# Patient Record
Sex: Female | Born: 1995 | Race: Black or African American | Hispanic: No | Marital: Married | Smoking: Never smoker
Health system: Southern US, Community
[De-identification: ages and names within clinical notes are randomized; demographics above are authoritative.]

## PROBLEM LIST (undated history)

## (undated) DIAGNOSIS — O24419 Gestational diabetes mellitus in pregnancy, unspecified control: Secondary | ICD-10-CM

## (undated) HISTORY — PX: NO PAST SURGERIES: SHX2092

## (undated) HISTORY — DX: Gestational diabetes mellitus in pregnancy, unspecified control: O24.419

---

## 2017-05-04 NOTE — L&D Delivery Note (Signed)
Called to room when pt c/o urge to push and BOW seen at introitus.  Had received 2 doses of cytotec.  Pt pushed and had SROM of brown fluid. Baby delivered w/next push.  Wt pending. Obvious anomalies noted.  Cord clamped and cut, baby taken out of room to prepare for parent's viewing per request.  At this time, there is no bleeding and placenta is still attached.  Pitocin bolus infusing.

## 2017-06-03 ENCOUNTER — Ambulatory Visit (INDEPENDENT_AMBULATORY_CARE_PROVIDER_SITE_OTHER): Payer: Medicaid Other | Admitting: Student

## 2017-06-03 ENCOUNTER — Other Ambulatory Visit (HOSPITAL_COMMUNITY)
Admission: RE | Admit: 2017-06-03 | Discharge: 2017-06-03 | Disposition: A | Discharge status: Home / Self Care | Payer: Medicaid Other | Source: Ambulatory Visit | Attending: Student | Admitting: Student

## 2017-06-03 ENCOUNTER — Encounter: Payer: Self-pay | Admitting: Student

## 2017-06-03 VITALS — BP 120/79 | HR 95 | Ht 61.0 in | Wt 103.3 lb

## 2017-06-03 DIAGNOSIS — Z3400 Encounter for supervision of normal first pregnancy, unspecified trimester: Secondary | ICD-10-CM | POA: Insufficient documentation

## 2017-06-03 DIAGNOSIS — Z3401 Encounter for supervision of normal first pregnancy, first trimester: Secondary | ICD-10-CM | POA: Diagnosis not present

## 2017-06-03 LAB — POCT URINALYSIS DIP (DEVICE)
Bilirubin Urine: NEGATIVE
Glucose, UA: NEGATIVE mg/dL
HGB URINE DIPSTICK: NEGATIVE
NITRITE: NEGATIVE
PH: 7 (ref 5.0–8.0)
Protein, ur: NEGATIVE mg/dL
Specific Gravity, Urine: 1.02 (ref 1.005–1.030)
UROBILINOGEN UA: 0.2 mg/dL (ref 0.0–1.0)

## 2017-06-03 NOTE — Patient Instructions (Signed)

## 2017-06-03 NOTE — Progress Notes (Signed)
  Subjective:    Felicia Clark is being seen today for her first obstetrical visit.  This is not a planned pregnancy. She is at [redacted]w[redacted]d gestation. Her obstetrical history is significant for nothing. Relationship with FOB: significant other, not living together. Patient does intend to breast feed. Pregnancy history fully reviewed.  Patient reports no complaints. Feeling good; still in school.   Review of Systems:   Review of Systems  Constitutional: Negative.   HENT: Negative.   Respiratory: Negative.   Cardiovascular: Negative.   Gastrointestinal: Negative.   Genitourinary: Negative.   Neurological: Negative.   Hematological: Negative.   Psychiatric/Behavioral: Negative.     Objective:     BP 120/79   Pulse 95   Ht 5\' 1"  (1.549 m)   Wt 103 lb 4.8 oz (46.9 kg)   LMP 03/26/2017 (Exact Date)   BMI 19.52 kg/m  Physical Exam  Constitutional: She is oriented to person, place, and time. She appears well-developed.  HENT:  Head: Normocephalic.  Neck: Normal range of motion.  Cardiovascular: Normal rate.  Respiratory: Effort normal.  GI: Soft.  Genitourinary: Vagina normal.  Musculoskeletal: Normal range of motion.  Neurological: She is alert and oriented to person, place, and time.  Skin: Skin is warm and dry.  Psychiatric: She has a normal mood and affect.    Exam    Assessment:    Pregnancy: G1P0 Patient Active Problem List   Diagnosis Date Noted  . Supervision of normal first pregnancy, antepartum 06/03/2017       Plan:     Initial labs drawn. Prenatal vitamins. Problem list reviewed and updated. AFP3 discussed: requested. Will do panorama today.  Role of ultrasound in pregnancy discussed; fetal survey: will order at next visit. . Amniocentesis discussed: not indicated. Follow up in 3 weeks. 75% of 30 min visit spent on counseling and coordination of care.  -Enrolled in [employer] -oriented patient to [employer] -Pap today -Warning signs discussed/when  to seek care at the [facility].   Mervyn Skeeters [facility] 06/03/2017

## 2017-06-03 NOTE — Addendum Note (Signed)
Addended by: Wendelyn Breslow L on: 06/03/2017 03:08 PM   Modules accepted: Orders

## 2017-06-04 LAB — OBSTETRIC PANEL, INCLUDING HIV
ANTIBODY SCREEN: NEGATIVE
BASOS: 0 %
Basophils Absolute: 0 10*3/uL (ref 0.0–0.2)
EOS (ABSOLUTE): 0 10*3/uL (ref 0.0–0.4)
EOS: 0 %
HEMATOCRIT: 37.8 % (ref 34.0–46.6)
HEMOGLOBIN: 12.5 g/dL (ref 11.1–15.9)
HIV Screen 4th Generation wRfx: NONREACTIVE
Hepatitis B Surface Ag: NEGATIVE
IMMATURE GRANS (ABS): 0 10*3/uL (ref 0.0–0.1)
IMMATURE GRANULOCYTES: 0 %
LYMPHS: 16 %
Lymphocytes Absolute: 1 10*3/uL (ref 0.7–3.1)
MCH: 29.1 pg (ref 26.6–33.0)
MCHC: 33.1 g/dL (ref 31.5–35.7)
MCV: 88 fL (ref 79–97)
MONOCYTES: 10 %
Monocytes Absolute: 0.6 10*3/uL (ref 0.1–0.9)
NEUTROS PCT: 74 %
Neutrophils Absolute: 4.6 10*3/uL (ref 1.4–7.0)
Platelets: 344 10*3/uL (ref 150–379)
RBC: 4.29 x10E6/uL (ref 3.77–5.28)
RDW: 14 % (ref 12.3–15.4)
RH TYPE: POSITIVE
RPR Ser Ql: NONREACTIVE
RUBELLA: 3.11 {index} (ref >0.99)
WBC: 6.3 10*3/uL (ref 3.4–10.8)

## 2017-06-04 LAB — CYTOLOGY - PAP
Chlamydia: NEGATIVE
Diagnosis: NEGATIVE
NEISSERIA GONORRHEA: NEGATIVE

## 2017-06-04 LAB — HEMOGLOBINOPATHY EVALUATION
Ferritin: 39 ng/mL (ref 15–150)
HGB A: 97.4 % (ref 96.4–98.8)
HGB S: 0 %
HGB SOLUBILITY: NEGATIVE
HGB VARIANT: 0 %
Hgb A2 Quant: 2.6 % (ref 1.8–3.2)
Hgb C: 0 %
Hgb F Quant: 0 % (ref 0.0–2.0)

## 2017-06-05 LAB — CULTURE, OB URINE

## 2017-06-05 LAB — URINE CULTURE, OB REFLEX

## 2017-06-14 LAB — SMN1 COPY NUMBER ANALYSIS (SMA CARRIER SCREENING)

## 2017-06-15 ENCOUNTER — Encounter: Payer: Self-pay | Admitting: *Deleted

## 2017-06-24 ENCOUNTER — Ambulatory Visit (INDEPENDENT_AMBULATORY_CARE_PROVIDER_SITE_OTHER): Payer: Medicaid Other | Admitting: Student

## 2017-06-24 VITALS — BP 120/72 | HR 81 | Wt 103.5 lb

## 2017-06-24 DIAGNOSIS — Z3491 Encounter for supervision of normal pregnancy, unspecified, first trimester: Secondary | ICD-10-CM

## 2017-06-24 DIAGNOSIS — Z3402 Encounter for supervision of normal first pregnancy, second trimester: Secondary | ICD-10-CM

## 2017-06-24 DIAGNOSIS — Z3490 Encounter for supervision of normal pregnancy, unspecified, unspecified trimester: Secondary | ICD-10-CM | POA: Insufficient documentation

## 2017-06-24 DIAGNOSIS — Z3400 Encounter for supervision of normal first pregnancy, unspecified trimester: Secondary | ICD-10-CM

## 2017-06-24 NOTE — Patient Instructions (Signed)

## 2017-06-24 NOTE — Progress Notes (Signed)
   PRENATAL VISIT NOTE  Subjective:  Felicia Clark is a 22 y.o. G1P0 at [redacted]w[redacted]d being seen today for ongoing prenatal care.  She is currently monitored for the following issues for this low-risk pregnancy and has Supervision of normal first pregnancy, antepartum and Uncertain dates, antepartum on their problem list.  Patient reports occasional cramps on her right side. Not associated with vaginal discharge, dysuria, back pain or bleeding. .  Contractions: Not present. Vag. Bleeding: None.  Movement: Absent. Denies leaking of fluid.   The following portions of the patient's history were reviewed and updated as appropriate: allergies, current medications, past family history, past medical history, past social history, past surgical history and problem list. Problem list updated.  Objective:   Vitals:   06/24/17 1141  BP: 120/72  Pulse: 81  Weight: 103 lb 8 oz (46.9 kg)    Fetal Status: Fetal Heart Rate (bpm): 152   Movement: Absent     General:  Alert, oriented and cooperative. Patient is in no acute distress.  Skin: Skin is warm and dry. No rash noted.   Cardiovascular: Normal heart rate noted  Respiratory: Normal respiratory effort, no problems with respiration noted  Abdomen: Soft, gravid, appropriate for gestational age.  Pain/Pressure: Absent     Pelvic: Cervical exam deferred        Extremities: Normal range of motion.  Edema: None  Mental Status:  Normal mood and affect. Normal behavior. Normal judgment and thought content.   Assessment and Plan:  Pregnancy: G1P0 at [redacted]w[redacted]d  1. Supervision of normal first pregnancy, antepartum  - [place] MFM OB COMP + 14 WK; Future - US OB Transvaginal; Future - US OB Comp Less 14 Wks; Future  2. Pregnancy with uncertain dates in first trimester -Patient is 12 weeks 6 days based on uncertain LMP, will order limited US for dating.  - Preterm labor symptoms and general obstetric precautions including but not limited to vaginal bleeding,  contractions, leaking of fluid and fetal movement were reviewed in detail with the patient. Please refer to After Visit Summary for other counseling recommendations.  Return in about 8 weeks (around 08/19/2017).   Starr Lake, CNM

## 2017-06-30 ENCOUNTER — Ambulatory Visit (HOSPITAL_COMMUNITY)
Admission: RE | Admit: 2017-06-30 | Discharge: 2017-06-30 | Disposition: A | Discharge status: Home / Self Care | Payer: Medicaid Other | Source: Ambulatory Visit | Attending: Student | Admitting: Student

## 2017-06-30 ENCOUNTER — Other Ambulatory Visit (HOSPITAL_COMMUNITY): Payer: Self-pay | Admitting: Obstetrics and Gynecology

## 2017-06-30 ENCOUNTER — Encounter (HOSPITAL_COMMUNITY): Payer: Self-pay

## 2017-06-30 ENCOUNTER — Other Ambulatory Visit: Payer: Self-pay | Admitting: Student

## 2017-06-30 ENCOUNTER — Ambulatory Visit (HOSPITAL_COMMUNITY)
Admission: RE | Admit: 2017-06-30 | Discharge: 2017-06-30 | Disposition: A | Discharge status: Home / Self Care | Payer: Medicaid Other | Source: Ambulatory Visit | Attending: Obstetrics and Gynecology | Admitting: Obstetrics and Gynecology

## 2017-06-30 DIAGNOSIS — O283 Abnormal ultrasonic finding on antenatal screening of mother: Secondary | ICD-10-CM

## 2017-06-30 DIAGNOSIS — O358XX0 Maternal care for other (suspected) fetal abnormality and damage, not applicable or unspecified: Secondary | ICD-10-CM | POA: Diagnosis not present

## 2017-06-30 DIAGNOSIS — Z3492 Encounter for supervision of normal pregnancy, unspecified, second trimester: Secondary | ICD-10-CM

## 2017-06-30 DIAGNOSIS — Z3A15 15 weeks gestation of pregnancy: Secondary | ICD-10-CM | POA: Diagnosis not present

## 2017-06-30 DIAGNOSIS — Z3400 Encounter for supervision of normal first pregnancy, unspecified trimester: Secondary | ICD-10-CM

## 2017-06-30 DIAGNOSIS — O359XX0 Maternal care for (suspected) fetal abnormality and damage, unspecified, not applicable or unspecified: Secondary | ICD-10-CM

## 2017-06-30 DIAGNOSIS — O3680X0 Pregnancy with inconclusive fetal viability, not applicable or unspecified: Secondary | ICD-10-CM

## 2017-06-30 DIAGNOSIS — Z3687 Encounter for antenatal screening for uncertain dates: Secondary | ICD-10-CM | POA: Insufficient documentation

## 2017-07-01 ENCOUNTER — Other Ambulatory Visit (HOSPITAL_COMMUNITY): Payer: Self-pay | Admitting: *Deleted

## 2017-07-01 DIAGNOSIS — R609 Edema, unspecified: Secondary | ICD-10-CM

## 2017-07-01 LAB — PARVOVIRUS B19 ANTIBODY, IGG AND IGM
PAROVIRUS B19 IGG ABS: 0.5 {index} (ref 0.0–0.8)
PAROVIRUS B19 IGM ABS: 0.2 {index} (ref 0.0–0.8)

## 2017-07-01 LAB — TOXOPLASMA ANTIBODIES- IGG AND  IGM: TOXOPLASMA ANTIBODY- IGM: 3.1 [AU]/ml (ref 0.0–7.9)

## 2017-07-01 LAB — CMV IGM: CMV IgM: <30 AU/mL (ref 0.0–29.9)

## 2017-07-01 LAB — CMV ANTIBODY, IGG (EIA): CMV Ab - IgG: <0.6 U/mL (ref 0.00–0.59)

## 2017-07-05 ENCOUNTER — Encounter: Payer: Self-pay | Admitting: *Deleted

## 2017-07-05 ENCOUNTER — Telehealth (HOSPITAL_COMMUNITY): Payer: Self-pay | Admitting: Obstetrics and Gynecology

## 2017-07-05 NOTE — Telephone Encounter (Signed)
MFM   Left message for patient to call [facility] to discuss lab work done on 06/30/17  [place], MD

## 2017-07-06 ENCOUNTER — Telehealth (HOSPITAL_COMMUNITY): Payer: Self-pay | Admitting: *Deleted

## 2017-07-08 ENCOUNTER — Other Ambulatory Visit (HOSPITAL_COMMUNITY): Payer: Self-pay | Admitting: Obstetrics and Gynecology

## 2017-07-08 ENCOUNTER — Encounter (HOSPITAL_COMMUNITY): Payer: Self-pay

## 2017-07-08 ENCOUNTER — Ambulatory Visit (HOSPITAL_COMMUNITY)
Admission: RE | Admit: 2017-07-08 | Discharge: 2017-07-08 | Disposition: A | Discharge status: Home / Self Care | Payer: Medicaid Other | Source: Ambulatory Visit | Attending: Student | Admitting: Student

## 2017-07-08 ENCOUNTER — Encounter: Payer: Self-pay | Admitting: Student

## 2017-07-08 DIAGNOSIS — R601 Generalized edema: Secondary | ICD-10-CM | POA: Diagnosis not present

## 2017-07-08 DIAGNOSIS — Z363 Encounter for antenatal screening for malformations: Secondary | ICD-10-CM | POA: Diagnosis present

## 2017-07-08 DIAGNOSIS — D181 Lymphangioma, any site: Secondary | ICD-10-CM | POA: Diagnosis not present

## 2017-07-08 DIAGNOSIS — Z3A20 20 weeks gestation of pregnancy: Secondary | ICD-10-CM | POA: Insufficient documentation

## 2017-07-08 DIAGNOSIS — J90 Pleural effusion, not elsewhere classified: Secondary | ICD-10-CM | POA: Insufficient documentation

## 2017-07-08 DIAGNOSIS — Z3A14 14 weeks gestation of pregnancy: Secondary | ICD-10-CM | POA: Insufficient documentation

## 2017-07-08 DIAGNOSIS — O359XX0 Maternal care for (suspected) fetal abnormality and damage, unspecified, not applicable or unspecified: Secondary | ICD-10-CM | POA: Diagnosis not present

## 2017-07-08 DIAGNOSIS — R609 Edema, unspecified: Secondary | ICD-10-CM

## 2017-07-08 DIAGNOSIS — Z3400 Encounter for supervision of normal first pregnancy, unspecified trimester: Secondary | ICD-10-CM

## 2017-07-08 DIAGNOSIS — Z3A16 16 weeks gestation of pregnancy: Secondary | ICD-10-CM

## 2017-07-08 DIAGNOSIS — R188 Other ascites: Secondary | ICD-10-CM | POA: Insufficient documentation

## 2017-07-08 DIAGNOSIS — O358XX0 Maternal care for other (suspected) fetal abnormality and damage, not applicable or unspecified: Secondary | ICD-10-CM | POA: Insufficient documentation

## 2017-07-08 NOTE — Progress Notes (Addendum)
Genetic Counseling  High-Risk Gestation Note  Appointment Date:  07/08/2017 Referred By: Luna Glasgow* Date of Birth:  10-May-1995 Partner:  [place], Brooke Bonito.    Pregnancy History: G1P0 Estimated Date of Delivery: 12/31/17 Estimated Gestational Age: 39w6dAttending: MRenella Cunas MD   I met with Ms. Felicia Bradfordand her partner, Mr. 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    In summary:  Discussed ultrasound findings in detail  Presence of hydrops associated with poor prognosis for pregnancy  Reviewed previous screening results  NIPS- Panorama within normal limits  Maternal TORCH studies  Reviewed options for additional screening  NIPS for single gene conditions ([facility]istara)-declined  Echocardiogram  Ongoing ultrasound  Reviewed options for diagnostic testing, including risks, benefits, limitations and alternatives-declined  Discussed option of continuation vs. Termination of pregnancy  Couple plans to continue pregnancy with expectant management  Reviewed family history concerns  We began by reviewing the ultrasound in detail. Ultrasound today again visualized cystic hygroma with ascites. Complete ultrasound results reported under separate cover. Ms. KWeimannpreviously had noninvasive prenatal screening (NIPS)/prenatal cell free DNA testing, which was within normal limits, and maternal TORCH titers were drawn 06/30/17. The couple understands that the presence of hydrops on ultrasound is associated with a very poor prognosis for the pregnancy.    They were counseled that a cystic hygroma describes a septated fluid filled sac at the back of the neck that typically results from failure of the fetal lymphatic system.  We discussed the various etiologies for a hygroma including normal variation (immature lymphatic system), a chromosome condition, single gene condition, or a congenital anomaly (heart  defect).   We discussed chromosomes, non-disjunction, age related risks for aneuploidy, and the ~50% ultrasound adjusted risk for a chromosome condition based on the finding of a cystic hygroma.  We discussed the common features of Down syndrome, Turner syndrome, and trisomies 13 and 18.  Ms. KFretwellpreviously had noninvasive prenatal screening (NIPS)/cell free DNA (cfDNA) testing through her obstetrician's office.  We reviewed that while this testing has a high sensitivity and specificity, it is not considered to be diagnostic.  For trisomy 275and trisomy 15 the detection rate is 99%; for trisomy 151 the detection rate is 96%, and for Turner syndrome, the detection rate is ~92%.  We reviewed Ms. KHillmannormal Panorama results and the associated significant reduction in risks for fetal aneuploidy.  We also discussed that cystic hygromas can also be caused by a variety of other chromosome aberrations including: microdeletions, microduplications, and translocations.  She was counseled that NIPS/cfDNA testing can detect a few microdeletions, but not those most commonly associated with cystic hygromas or abnormal nuchal translucency measurements.  We reviewed the availability of diagnostic testing for chromosome conditions through karyotype and chromosomal microarray analysis on amniocentesis.  She was counseled regarding the associated risks for complications with amniocentesis, including spontaneous pregnancy loss. After consideration of all the options, the couple declined amniocentesis.      We also discussed that a cystic hygroma can be a feature of many underlying single gene conditions, such as Noonan syndrome, SLOS, and skeletal dysplasias.  We discussed that prenatal diagnosis is available for some single gene conditions, but that in most cases targeted analysis is recommended by a medical geneticist following a post-natal physical exam and review of medical records.  If however, ultrasound findings or a  positive family history strongly suggest an increased risk for a specific condition, testing may  be performed prenatally.    Regarding available prenatal screening for certain single gene conditions, we discussed the option of single gene NIPT (Vistara by Johnsie Cancel). We discussed that there is a newer NIPS platform ([facility]istara through [facility]) that is able to assess for mutations in a panel of 30 selected genes. The conditions selected for this panel are autosomal dominant or X-linked conditions that typically occur due to de novo gene variations. We reviewed the methodology behind this screen and that the reported detection rate ranges from 43% to greater than 96%, depending upon the specific condition and specific gene. We reviewed possible results including positive, negative, unexpected findings, and no result. We reviewed limitations of the test including that it is not diagnostic, it relies on a high enough fetal fraction in the sample, and that if the patient carriers a mutation in one of the genes on the panel, analysis cannot be performed for the pregnancy. We also discussed that if a mutation is identified in the paternal sample, this will also be reported to the couple. We reviewed the billing process and possible cost of the test. Ms.Hitsman declined Vistara at this time.   We discussed the option of carrier screening for a select panel of autosomal recessive and some X-linked conditions, some of which but not all can have cystic hygroma and hydrops as an associated feature. ACOG currently recommends that all patients be offered carrier screening for cystic fibrosis, spinal muscular atrophy and hemoglobinopathies. In addition, they were counseled that there are a variety of genetic screening laboratories that have pan-ethnic, or expanded, carrier screening panels, which evaluate carrier status for a wide range of genetic conditions. Some of these conditions are severe and actionable, but also rare; others  occur more commonly, but are less severe. We discussed that testing options range from screening for a single condition to panels of more than 200 autosomal or X-linked genetic conditions. The prevalence of each condition varies (and often varies with ethnicity). Thus the couples' background risk to be a carrier for each of these various conditions would range, and in some cases be very low or unknown. We reviewed that a negative carrier screen would thus reduce, but not eliminate the chance to be a carrier for these conditions. For some conditions included on specific pan-ethnic carrier screening panels, the phenotype may not yet be well defined. For the majority of conditions on pan-ethnic carrier screening panels, identification of carrier status is not expected to be associated with medical features for the carrier; However, there are currently few exceptions where carrier status has been shown to increase the chance for certain medical concerns. We reviewed that in the event that one partner is found to be a carrier for one or more conditions, carrier screening would be available to the partner for those conditions. We discussed the risks, benefits, and limitations of carrier screening with the couple. After thoughtful consideration of their options, Ms. Preet Foresta declined expanded pan-ethnic carrier screening (including ACOG recommended panel) at this time.   Additionally, we discussed that congenital heart defects (CHD) are commonly associated with cystic hygromas.  The prevalence of a CHD is in the order of 6 times higher in fetuses with a cystic hygroma/increased nuchal translucency as compared to an unselected population.  There does not appear to be an association between any specific CHD and cystic hygromas.   We discussed that the chance for a CHD increases exponentially with increasing NT measurements.     We discussed options for the  pregnancy including continuing with expectant management and  termination of pregnancy. The couple declined termination of pregnancy at this time and plan to continue the pregnancy. Follow-up ultrasound was scheduled in 1 weeks to reassess hydrops and assess for viability.     Both family histories were reviewed and found to be contributory for a female maternal second cousin to the patient with Down syndrome. We discussed that 95% of cases of Down syndrome are not inherited and are the result of non-disjunction.  Three to 4% of cases of Down syndrome are the result of a translocation involving chromosome #21.  We discussed the option of chromosome analysis to determine if an individual is a carrier of a balanced translocation involving chromosome #21.  If an individual carries a balanced translocation involving chromosome #21, then the chance to have a baby with Down syndrome would be greater than the maternal age-related risk. The reported family history is most suggestive of sporadically occurring Down syndrome. Without further information regarding the provided family history, an accurate genetic risk cannot be calculated. Further genetic counseling is warranted if more information is obtained.   Ms. Jaren Kearn denied exposure to environmental toxins or chemical agents. She denied the use of alcohol, tobacco or street drugs. She denied significant viral illnesses during the course of her pregnancy.     I counseled this couple regarding the above risks and available options.  The approximate face-to-face time with the genetic counselor was 40 minutes.  Chipper Oman, MS Certified Genetic Counselor 07/08/2017

## 2017-07-09 ENCOUNTER — Other Ambulatory Visit (HOSPITAL_COMMUNITY): Payer: Self-pay | Admitting: *Deleted

## 2017-07-16 ENCOUNTER — Encounter (HOSPITAL_COMMUNITY): Payer: Self-pay

## 2017-07-16 ENCOUNTER — Ambulatory Visit (HOSPITAL_COMMUNITY)
Admission: RE | Admit: 2017-07-16 | Discharge: 2017-07-16 | Disposition: A | Discharge status: Home / Self Care | Payer: Medicaid Other | Source: Ambulatory Visit | Attending: Student | Admitting: Student

## 2017-07-16 ENCOUNTER — Other Ambulatory Visit (HOSPITAL_COMMUNITY): Payer: Self-pay | Admitting: Maternal and Fetal Medicine

## 2017-07-16 DIAGNOSIS — O359XX0 Maternal care for (suspected) fetal abnormality and damage, unspecified, not applicable or unspecified: Secondary | ICD-10-CM

## 2017-07-16 DIAGNOSIS — Z3A17 17 weeks gestation of pregnancy: Secondary | ICD-10-CM

## 2017-07-16 DIAGNOSIS — Z362 Encounter for other antenatal screening follow-up: Secondary | ICD-10-CM

## 2017-07-16 DIAGNOSIS — Z3400 Encounter for supervision of normal first pregnancy, unspecified trimester: Secondary | ICD-10-CM

## 2017-07-19 ENCOUNTER — Other Ambulatory Visit (HOSPITAL_COMMUNITY): Payer: Self-pay | Admitting: *Deleted

## 2017-07-19 DIAGNOSIS — D181 Lymphangioma, any site: Secondary | ICD-10-CM

## 2017-07-21 ENCOUNTER — Telehealth: Payer: Self-pay | Admitting: General Practice

## 2017-07-21 NOTE — Telephone Encounter (Signed)
Patient was removed from babyscripts.  Called patient to schedule follow up OB appointment for 07/29/17 at 2:55pm.  Patient voiced understanding.

## 2017-07-23 ENCOUNTER — Ambulatory Visit (HOSPITAL_COMMUNITY)
Admission: RE | Admit: 2017-07-23 | Discharge: 2017-07-23 | Disposition: A | Discharge status: Home / Self Care | Payer: Medicaid Other | Source: Ambulatory Visit | Attending: Student | Admitting: Student

## 2017-07-23 ENCOUNTER — Other Ambulatory Visit (HOSPITAL_COMMUNITY): Payer: Self-pay | Admitting: Obstetrics and Gynecology

## 2017-07-23 ENCOUNTER — Other Ambulatory Visit (HOSPITAL_COMMUNITY): Payer: Self-pay | Admitting: *Deleted

## 2017-07-23 ENCOUNTER — Encounter (HOSPITAL_COMMUNITY): Payer: Self-pay

## 2017-07-23 DIAGNOSIS — O358XX0 Maternal care for other (suspected) fetal abnormality and damage, not applicable or unspecified: Secondary | ICD-10-CM | POA: Diagnosis not present

## 2017-07-23 DIAGNOSIS — O359XX0 Maternal care for (suspected) fetal abnormality and damage, unspecified, not applicable or unspecified: Secondary | ICD-10-CM | POA: Diagnosis present

## 2017-07-23 DIAGNOSIS — Z3A18 18 weeks gestation of pregnancy: Secondary | ICD-10-CM

## 2017-07-23 DIAGNOSIS — Z362 Encounter for other antenatal screening follow-up: Secondary | ICD-10-CM

## 2017-07-23 DIAGNOSIS — J90 Pleural effusion, not elsewhere classified: Secondary | ICD-10-CM | POA: Diagnosis not present

## 2017-07-23 DIAGNOSIS — R601 Generalized edema: Secondary | ICD-10-CM | POA: Diagnosis not present

## 2017-07-23 DIAGNOSIS — R188 Other ascites: Secondary | ICD-10-CM | POA: Diagnosis not present

## 2017-07-23 DIAGNOSIS — D181 Lymphangioma, any site: Secondary | ICD-10-CM | POA: Diagnosis not present

## 2017-07-23 DIAGNOSIS — Z3400 Encounter for supervision of normal first pregnancy, unspecified trimester: Secondary | ICD-10-CM

## 2017-07-27 ENCOUNTER — Encounter (HOSPITAL_COMMUNITY): Payer: Self-pay

## 2017-07-27 ENCOUNTER — Ambulatory Visit (HOSPITAL_COMMUNITY)
Admission: RE | Admit: 2017-07-27 | Discharge: 2017-07-27 | Disposition: A | Discharge status: Home / Self Care | Payer: Medicaid Other | Source: Ambulatory Visit | Attending: Student | Admitting: Student

## 2017-07-27 DIAGNOSIS — Z3400 Encounter for supervision of normal first pregnancy, unspecified trimester: Secondary | ICD-10-CM

## 2017-07-27 DIAGNOSIS — O359XX0 Maternal care for (suspected) fetal abnormality and damage, unspecified, not applicable or unspecified: Secondary | ICD-10-CM | POA: Insufficient documentation

## 2017-07-27 DIAGNOSIS — Z3A18 18 weeks gestation of pregnancy: Secondary | ICD-10-CM | POA: Insufficient documentation

## 2017-07-27 DIAGNOSIS — O358XX0 Maternal care for other (suspected) fetal abnormality and damage, not applicable or unspecified: Secondary | ICD-10-CM

## 2017-07-28 ENCOUNTER — Other Ambulatory Visit (HOSPITAL_COMMUNITY): Payer: Self-pay | Admitting: *Deleted

## 2017-07-28 NOTE — Progress Notes (Signed)
Met with patient briefly today following her ultrasound appt. Patient previously saw Santiago Glad Corneliussen for genetic counseling and had questions today regarding additional genetic testing options. Discussed previously performed screening tests and results. Discussed the difference between diagnostic testing versus screening. We reviewed the availability of amniocentesis with karyotype and microarray analyses. We reviewed the benefits, limitations, and risks of amniocentesis, including the 1 in 300-500 risk for miscarriage. We reviewed the option of Vistara screening (single gene NIPS), which screens placental DNA in maternal circulation for specific mutations in a panel of 30 selected genes covering 26 conditions. Most of these conditions follow an autosomal dominant pattern of inheritance and typically occur due to de novo gene mutations. The detection rates for these conditions vary depending upon the specific condition but range from 43% to 96%. Therefore, this screening would not identify all new dominant gene mutations. We reviewed that a low risk Vistara result reduces the likelihood of some gene conditions, which can be associated with a cystic hygroma/hydrops, but does not rule out all genetic conditions. We reviewed that Vistara screening requires a maternal and paternal sample of blood. The patient's partner was not present at the ultrasound today. She stated that she has an OBV on Thursday, 07/29/17, and will bring her partner with her to [facility] to pursue Vistara screening. She declined amniocentesis at this time. All questions were answered and concerns addressed.   I met with this patient for 10 minutes following her ultrasound appt. She was encouraged to call with additional questions/concerns.  Filbert Schilder, MS CGC

## 2017-07-29 ENCOUNTER — Ambulatory Visit (INDEPENDENT_AMBULATORY_CARE_PROVIDER_SITE_OTHER): Payer: Medicaid Other | Admitting: Family Medicine

## 2017-07-29 VITALS — BP 110/62 | HR 93 | Wt 105.0 lb

## 2017-07-29 DIAGNOSIS — O1202 Gestational edema, second trimester: Secondary | ICD-10-CM

## 2017-07-29 DIAGNOSIS — O358XX0 Maternal care for other (suspected) fetal abnormality and damage, not applicable or unspecified: Secondary | ICD-10-CM | POA: Diagnosis not present

## 2017-07-29 DIAGNOSIS — Z3400 Encounter for supervision of normal first pregnancy, unspecified trimester: Secondary | ICD-10-CM

## 2017-07-29 DIAGNOSIS — Z3402 Encounter for supervision of normal first pregnancy, second trimester: Secondary | ICD-10-CM | POA: Diagnosis present

## 2017-07-29 NOTE — Progress Notes (Signed)
   PRENATAL VISIT NOTE  Subjective:  Felicia Clark is a 22 y.o. G1P0 at [redacted]w[redacted]d being seen today for ongoing prenatal care.  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  Patient reports no complaints.  Contractions: Not present. Vag. Bleeding: None.  Movement: Absent. Denies leaking of fluid.   The following portions of the patient's history were reviewed and updated as appropriate: allergies, current medications, past family history, past medical history, past social history, past surgical history and problem list. Problem list updated.  Objective:   Vitals:   07/29/17 1456  BP: 110/62  Pulse: 93  Weight: 105 lb (47.6 kg)    Fetal Status: Fetal Heart Rate (bpm): 144 Fundal Height: 17 cm Movement: Absent     General:  Alert, oriented and cooperative. Patient is in no acute distress.  Skin: Skin is warm and dry. No rash noted.   Cardiovascular: Normal heart rate noted  Respiratory: Normal respiratory effort, no problems with respiration noted  Abdomen: Soft, gravid, appropriate for gestational age.  Pain/Pressure: Absent     Pelvic: Cervical exam deferred        Extremities: Normal range of motion.  Edema: None  Mental Status:  Normal mood and affect. Normal behavior. Normal judgment and thought content.   Assessment and Plan:  Pregnancy: G1P0 at [redacted]w[redacted]d  1. Supervision of normal first pregnancy, antepartum FHT and FH normal. AFP drawn today - AFP, Serum, Open Spina Bifida  2. 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 Getting weekly scans for viability with MFM. Does not wish to terminate pregnancy.  - AFP, Serum, Open Spina Bifida  3. Gestational edema in second trimester   Preterm labor symptoms and general obstetric  precautions including but not limited to vaginal bleeding, contractions, leaking of fluid and fetal movement were reviewed in detail with the patient. Please refer to After Visit Summary for other counseling recommendations.  Return in about 3 weeks (around 08/19/2017) for HR OB f/u.   Truett Mainland, DO

## 2017-08-01 LAB — AFP, SERUM, OPEN SPINA BIFIDA
AFP MOM: 0.72
AFP VALUE AFPOSL: 40.7 ng/mL
Gest. Age on Collection Date: 17.6 weeks
MATERNAL AGE AT EDD: 23.7 a
OSBR Risk 1 IN: 10000
Test Results:: NEGATIVE
WEIGHT: 105 [lb_av]

## 2017-08-03 ENCOUNTER — Ambulatory Visit (HOSPITAL_COMMUNITY): Admission: RE | Admit: 2017-08-03 | Payer: Medicaid Other | Source: Ambulatory Visit

## 2017-08-03 ENCOUNTER — Encounter (HOSPITAL_COMMUNITY): Payer: Self-pay

## 2017-08-03 ENCOUNTER — Ambulatory Visit (HOSPITAL_COMMUNITY)
Admission: RE | Admit: 2017-08-03 | Discharge: 2017-08-03 | Disposition: A | Discharge status: Home / Self Care | Payer: Medicaid Other | Source: Ambulatory Visit | Attending: Student | Admitting: Student

## 2017-08-03 ENCOUNTER — Other Ambulatory Visit (HOSPITAL_COMMUNITY): Payer: Self-pay | Admitting: Obstetrics and Gynecology

## 2017-08-03 DIAGNOSIS — O359XX0 Maternal care for (suspected) fetal abnormality and damage, unspecified, not applicable or unspecified: Secondary | ICD-10-CM

## 2017-08-03 DIAGNOSIS — Z3A19 19 weeks gestation of pregnancy: Secondary | ICD-10-CM

## 2017-08-03 DIAGNOSIS — O358XX0 Maternal care for other (suspected) fetal abnormality and damage, not applicable or unspecified: Secondary | ICD-10-CM | POA: Insufficient documentation

## 2017-08-03 NOTE — Addendum Note (Signed)
Encounter addended by: Abigail Butts, RDMS, RVT on: 08/03/2017 11:33 AM  Actions taken: Imaging Exam ended

## 2017-08-06 ENCOUNTER — Ambulatory Visit (HOSPITAL_COMMUNITY): Payer: Medicaid Other

## 2017-08-10 ENCOUNTER — Ambulatory Visit (HOSPITAL_COMMUNITY): Admission: RE | Admit: 2017-08-10 | Payer: Medicaid Other | Source: Ambulatory Visit

## 2017-08-11 ENCOUNTER — Ambulatory Visit (HOSPITAL_COMMUNITY)
Admission: RE | Admit: 2017-08-11 | Discharge: 2017-08-11 | Disposition: A | Discharge status: Home / Self Care | Payer: Medicaid Other | Source: Ambulatory Visit | Attending: Obstetrics and Gynecology | Admitting: Obstetrics and Gynecology

## 2017-08-11 ENCOUNTER — Encounter (HOSPITAL_COMMUNITY): Payer: Self-pay

## 2017-08-11 DIAGNOSIS — O358XX0 Maternal care for other (suspected) fetal abnormality and damage, not applicable or unspecified: Secondary | ICD-10-CM | POA: Insufficient documentation

## 2017-08-11 DIAGNOSIS — Z3A21 21 weeks gestation of pregnancy: Secondary | ICD-10-CM | POA: Insufficient documentation

## 2017-08-11 DIAGNOSIS — Z3400 Encounter for supervision of normal first pregnancy, unspecified trimester: Secondary | ICD-10-CM

## 2017-08-17 ENCOUNTER — Ambulatory Visit (HOSPITAL_COMMUNITY): Payer: Medicaid Other

## 2017-08-19 ENCOUNTER — Encounter: Payer: Self-pay | Admitting: Obstetrics and Gynecology

## 2017-08-19 ENCOUNTER — Encounter (HOSPITAL_COMMUNITY): Payer: Self-pay

## 2017-08-19 ENCOUNTER — Ambulatory Visit (INDEPENDENT_AMBULATORY_CARE_PROVIDER_SITE_OTHER): Payer: Medicaid Other | Admitting: Obstetrics and Gynecology

## 2017-08-19 ENCOUNTER — Inpatient Hospital Stay (HOSPITAL_COMMUNITY)
Admission: AD | Admit: 2017-08-19 | Discharge: 2017-08-21 | DRG: 805 | Disposition: A | Discharge status: Home / Self Care | Payer: Medicaid Other | Source: Ambulatory Visit | Attending: Family Medicine | Admitting: Family Medicine

## 2017-08-19 ENCOUNTER — Other Ambulatory Visit: Payer: Self-pay

## 2017-08-19 VITALS — BP 128/70 | HR 98 | Wt 108.7 lb

## 2017-08-19 DIAGNOSIS — Z3490 Encounter for supervision of normal pregnancy, unspecified, unspecified trimester: Secondary | ICD-10-CM | POA: Diagnosis present

## 2017-08-19 DIAGNOSIS — R609 Edema, unspecified: Secondary | ICD-10-CM | POA: Diagnosis present

## 2017-08-19 DIAGNOSIS — Z3A21 21 weeks gestation of pregnancy: Secondary | ICD-10-CM | POA: Diagnosis not present

## 2017-08-19 DIAGNOSIS — Z3A22 22 weeks gestation of pregnancy: Secondary | ICD-10-CM | POA: Diagnosis not present

## 2017-08-19 DIAGNOSIS — O358XX0 Maternal care for other (suspected) fetal abnormality and damage, not applicable or unspecified: Secondary | ICD-10-CM | POA: Diagnosis present

## 2017-08-19 DIAGNOSIS — O402XX0 Polyhydramnios, second trimester, not applicable or unspecified: Secondary | ICD-10-CM | POA: Diagnosis not present

## 2017-08-19 DIAGNOSIS — O411230 Chorioamnionitis, third trimester, not applicable or unspecified: Secondary | ICD-10-CM | POA: Diagnosis present

## 2017-08-19 DIAGNOSIS — O364XX0 Maternal care for intrauterine death, not applicable or unspecified: Principal | ICD-10-CM | POA: Diagnosis present

## 2017-08-19 DIAGNOSIS — O021 Missed abortion: Secondary | ICD-10-CM

## 2017-08-19 LAB — CBC
HEMATOCRIT: 37.1 % (ref 36.0–46.0)
Hemoglobin: 12.9 g/dL (ref 12.0–15.0)
MCH: 30.4 pg (ref 26.0–34.0)
MCHC: 34.8 g/dL (ref 30.0–36.0)
MCV: 87.3 fL (ref 78.0–100.0)
Platelets: 359 10*3/uL (ref 150–400)
RBC: 4.25 MIL/uL (ref 3.87–5.11)
RDW: 13.2 % (ref 11.5–15.5)
WBC: 7.3 10*3/uL (ref 4.0–10.5)

## 2017-08-19 LAB — RAPID HIV SCREEN (HIV 1/2 AB+AG)
HIV 1/2 Antibodies: NONREACTIVE
HIV-1 P24 Antigen - HIV24: NONREACTIVE

## 2017-08-19 LAB — ABO/RH: ABO/RH(D): O POS

## 2017-08-19 LAB — TYPE AND SCREEN
ABO/RH(D): O POS
Antibody Screen: NEGATIVE

## 2017-08-19 MED ORDER — HYDROMORPHONE HCL 1 MG/ML IJ SOLN
1.0000 mg | INTRAMUSCULAR | Status: DC | PRN
  Administered 2017-08-20: 1 mg via INTRAVENOUS
  Filled 2017-08-19 (×2): qty 1

## 2017-08-19 MED ORDER — MISOPROSTOL 200 MCG PO TABS
400.0000 ug | ORAL_TABLET | ORAL | Status: DC
  Administered 2017-08-19 – 2017-08-20 (×2): 400 ug via VAGINAL
  Filled 2017-08-19 (×2): qty 2

## 2017-08-19 MED ORDER — OXYTOCIN 40 UNITS IN LACTATED RINGERS INFUSION - SIMPLE MED
2.5000 [IU]/h | INTRAVENOUS | Status: DC
  Administered 2017-08-20: 2.5 [IU]/h via INTRAVENOUS
  Filled 2017-08-19: qty 1000

## 2017-08-19 MED ORDER — LACTATED RINGERS IV SOLN: 500.0000 mL | INTRAVENOUS | Status: DC | PRN

## 2017-08-19 MED ORDER — LACTATED RINGERS IV SOLN
INTRAVENOUS | Status: DC
  Administered 2017-08-19 (×2): via INTRAVENOUS

## 2017-08-19 MED ORDER — OXYCODONE-ACETAMINOPHEN 5-325 MG PO TABS
2.0000 | ORAL_TABLET | ORAL | Status: DC | PRN
  Administered 2017-08-19 (×2): 2 via ORAL
  Filled 2017-08-19 (×2): qty 2

## 2017-08-19 MED ORDER — MISOPROSTOL 200 MCG PO TABS: 200.0000 ug | ORAL_TABLET | ORAL | Status: DC | PRN

## 2017-08-19 MED ORDER — OXYCODONE-ACETAMINOPHEN 5-325 MG PO TABS
1.0000 | ORAL_TABLET | ORAL | Status: DC | PRN
  Administered 2017-08-20: 1 via ORAL
  Filled 2017-08-19 (×2): qty 1

## 2017-08-19 MED ORDER — MISOPROSTOL 200 MCG PO TABS
600.0000 ug | ORAL_TABLET | Freq: Once | ORAL | Status: AC
  Administered 2017-08-19: 600 ug via VAGINAL
  Filled 2017-08-19: qty 3

## 2017-08-19 MED ORDER — SOD CITRATE-CITRIC ACID 500-334 MG/5ML PO SOLN: 30.0000 mL | ORAL | Status: DC | PRN

## 2017-08-19 MED ORDER — OXYTOCIN BOLUS FROM INFUSION
500.0000 mL | Freq: Once | INTRAVENOUS | Status: AC
  Administered 2017-08-20: 500 mL via INTRAVENOUS

## 2017-08-19 MED ORDER — ONDANSETRON HCL 4 MG/2ML IJ SOLN
4.0000 mg | Freq: Four times a day (QID) | INTRAMUSCULAR | Status: DC | PRN
  Administered 2017-08-19: 4 mg via INTRAVENOUS
  Filled 2017-08-19: qty 2

## 2017-08-19 MED ORDER — ACETAMINOPHEN 325 MG PO TABS
650.0000 mg | ORAL_TABLET | ORAL | Status: DC | PRN
  Administered 2017-08-20: 650 mg via ORAL
  Filled 2017-08-19: qty 2

## 2017-08-19 MED ORDER — LIDOCAINE HCL (PF) 1 % IJ SOLN
30.0000 mL | INTRAMUSCULAR | Status: DC | PRN
  Filled 2017-08-19: qty 30

## 2017-08-19 NOTE — Progress Notes (Signed)
22 yo G1P0 with 19 weeks fetal demise diagnosed on 08/11/2017 here to discuss management plan. Discussed options of D&E vs induction of labor. Risks, benefits of both options reviewed with the patient. Patient opted for IOL. It was explained to the patient that given her early gestational age, it could be a multi-day induction. Patient also informed that she may need to go to the operating room for D&E if concerns for retained POC arise. Patient verbalized understanding and all questions were answered. Patient is considering cytogenetic analysis on fetal tissue

## 2017-08-19 NOTE — Progress Notes (Deleted)
Obstetric History and Physical  Felicia Clark is a 22 y.o. G1P0 with diagnosis of a 21 week intrauterine fetal demise with known large cystic hygroma and hydrops fetalis.  Had negative TORCH titers, no genetic testing done yet.  Patient was seen in the office today; was counseled about IOL vs D&E, details/risks/benefits reviewed in detail. She opted for IOL.  Currently, she has no pain, fevers, bleeding or other concerns. Has a support person with her.  No significantconcerns.  Past Medical History No past medical history on file.   Past Surgical History:  Procedure Laterality Date  . NO PAST SURGERIES     OB History  Gravida Para Term Preterm AB Living  1         0  SAB TAB Ectopic Multiple Live Births               # Outcome Date GA Lbr Len/2nd Weight Sex Delivery Anes PTL Lv  1 Current           Patient denies any other pertinent gynecologic issues.   No current facility-administered medications on file prior to encounter.    Current Outpatient Medications on File Prior to Encounter  Medication Sig Dispense Refill  . acetaminophen (TYLENOL) 325 MG tablet Take 325 mg by mouth every 6 (six) hours as needed.    . Cyanocobalamin (B-12 PO) Take by mouth.     No Known Allergies  Social History:   reports that she has never smoked. She has never used smokeless tobacco. She reports that she does not drink alcohol or use drugs.  No family history on file.  Review of Systems: Pertinent items noted in HPI and remainder of comprehensive ROS otherwise negative.  PHYSICAL EXAM: Blood pressure 127/82, pulse (!) 108, resp. rate 16, last menstrual period 03/26/2017, SpO2 100 %. CONSTITUTIONAL: Well-developed, well-nourished female in no acute distress.  HENT:  Normocephalic, atraumatic, External right and left ear normal. Oropharynx is clear and moist EYES: Conjunctivae and EOM are normal. Pupils are equal, round, and reactive to light. No scleral icterus.  NECK: Normal range of motion,  supple, no masses SKIN: Skin is warm and dry. No rash noted. Not diaphoretic. No erythema. No pallor. NEUROLOGIC: Alert and oriented to person, place, and time. Normal reflexes, muscle tone coordination. No cranial nerve deficit noted. PSYCHIATRIC: Normal mood and affect. Normal behavior. Normal judgment and thought content. CARDIOVASCULAR: Normal heart rate noted, regular rhythm RESPIRATORY: Effort and breath sounds normal, no problems with respiration noted ABDOMEN: Soft, gravid, nontender, nondistended. PELVIC: Deferred MUSCULOSKELETAL: Normal range of motion. No edema and no tenderness. 2+ distal pulses.  Labs: Results for orders placed or performed during the hospital encounter of 08/19/17 (from the past 336 hour(s))  CBC   Collection Time: 08/19/17  4:20 PM  Result Value Ref Range   WBC 7.3 4.0 - 10.5 K/uL   RBC 4.25 3.87 - 5.11 MIL/uL   Hemoglobin 12.9 12.0 - 15.0 g/dL   HCT 37.1 36.0 - 46.0 %   MCV 87.3 78.0 - 100.0 fL   MCH 30.4 26.0 - 34.0 pg   MCHC 34.8 30.0 - 36.0 g/dL   RDW 13.2 11.5 - 15.5 %   Platelets 359 150 - 400 K/uL    Imaging Studies: [place] Mfm Ob Limited  Result Date: 08/11/2017 ----------------------------------------------------------------------  OBSTETRICS REPORT                      (Signed Final 08/11/2017 04:12 pm) ---------------------------------------------------------------------- Patient Info  ID #:       [id number]                          D.O.B.:  27-Jun-1995 (21 yrs)  Name:       Felicia Clark                   Visit Date: 08/11/2017 03:12 pm ---------------------------------------------------------------------- Performed By  Performed By:     Felecia Jan        Ref. Address:     [place]                    [address]                                                             [place] [address]  Attending:        Jolyn Lent MD      Location:         [facility]  Referred By:       Luna Glasgow CNM ---------------------------------------------------------------------- Orders   #  Description                                 Code   1  [place] MFM OB LIMITED                           [phone]  ----------------------------------------------------------------------   #  Ordered By               Order #        Accession #    Episode #   1  [place]            [id number]      [id number]     [id number]  ---------------------------------------------------------------------- Indications   [redacted] weeks gestation of pregnancy                Z3A.21   Fetal abnormality - other known or             O35.9XX0   suspected; cystic hygroma, ascites; Low risk   NIPS, WNL TORCH titers   Fetal demise less than 22 weeks                O02.1  ---------------------------------------------------------------------- OB History  Gravidity:    1         Term:   0        Prem:   0        SAB:   0  TOP:          0       Ectopic:  0  Living: 0 ---------------------------------------------------------------------- Fetal Evaluation  Num Of Fetuses:     1  Cardiac Activity:   Absent  Presentation:       Cephalic  Amniotic Fluid  AFI FV:      Subjectively within normal limits                              Largest Pocket(cm)                              4.3 ---------------------------------------------------------------------- Gestational Age  LMP:           19w 5d        Date:  03/26/17                 EDD:   12/31/17  Best:          Audrea Muscat 0d     Det. By:  U/S  (06/30/17)          EDD:   12/22/17 ---------------------------------------------------------------------- Cervix Uterus Adnexa  Cervix  Length:            3.7  cm. ---------------------------------------------------------------------- Comments  No fetal heart activity was noted on today's scan indicating an  intrauterine fetal demise, which I discussed with Ms. Moret.  We also discussed management options including IOL vs.  D&E.  She is uncertain  how she wishes to proceed and would  like to discuss the matter further with the FOB and her family.  She will make an appointment in the high risk clinic at a time  when the FOB can be available to discuss next steps with her  primary team. ---------------------------------------------------------------------- Impression  Intrauterine fetal demise at 21 weeks 0 days.  Known cystic hygrom and hydrops fetalis. ---------------------------------------------------------------------- Recommendations  See comments. ----------------------------------------------------------------------                 Jolyn Lent, MD Electronically Signed Final Report   08/11/2017 04:12 pm ----------------------------------------------------------------------  [place] Mfm Ob Limited  Result Date: 08/03/2017 ----------------------------------------------------------------------  OBSTETRICS REPORT                      (Signed Final 08/03/2017 11:31 am) ---------------------------------------------------------------------- Patient Info  ID #:       [id number]                          D.O.B.:  May 02, 1996 (21 yrs)  Name:       Felicia Clark                   Visit Date: 08/03/2017 11:08 am ---------------------------------------------------------------------- Performed By  Performed By:     Rodrigo Ran BS      Ref. Address:     [place]  [place] [address]  Attending:        Griffin Dakin MD         Location:         [facility]  Referred By:      Luna Glasgow CNM ---------------------------------------------------------------------- Orders   #  Description                                 Code   1  [place] MFM OB LIMITED                           [phone]  ----------------------------------------------------------------------   #  Ordered By                Order #        Accession #    Episode #   1  [place]            [id number]      [id number]     [id number]  ---------------------------------------------------------------------- Indications   [redacted] weeks gestation of pregnancy                Z3A.19   Fetal abnormality - other known or             O35.9XX0   suspected; cystic hygroma, ascites; Low risk   NIPS, WNL TORCH titers  ---------------------------------------------------------------------- OB History  Gravidity:    1         Term:   0        Prem:   0        SAB:   0  TOP:          0       Ectopic:  0        Living: 0 ---------------------------------------------------------------------- Fetal Evaluation  Num Of Fetuses:     1  Fetal Heart         132  Rate(bpm):  Cardiac Activity:   Observed  Presentation:       Cephalic  Placenta:           Posterior, above cervical os  P. Cord Insertion:  Previously Visualized  Amniotic Fluid  AFI FV:      Oligohydramnios                              Largest Pocket(cm)                              1.92 ---------------------------------------------------------------------- Gestational Age  LMP:           18w 4d        Date:  03/26/17                 EDD:   12/31/17  Best:          19w 6d     Det. By:  U/S  (06/30/17)          EDD:   12/22/17 ---------------------------------------------------------------------- Cervix Uterus Adnexa  Cervix  Length:           3.07  cm.  Normal appearance by transabdominal scan.  Uterus  No abnormality visualized.  Left Ovary  Within  normal limits.  Right Ovary  Within normal limits.  Cul De Sac:   No free fluid seen.  Adnexa:       No abnormality visualized. ---------------------------------------------------------------------- Impression  SIUP at 19+6 weeks  Fetal cardiac activity noted  Large cystic hygroma (+ nuchal hygromas) with significant  ascites and very small pleural effusion; significant  generalized edema; very little fetal movement  Low amniotic fluid volume  ---------------------------------------------------------------------- Recommendations  Recheck scan for viability in one week ----------------------------------------------------------------------                 Griffin Dakin, MD Electronically Signed Final Report   08/03/2017 11:31 am ----------------------------------------------------------------------  [place] Mfm Ob Limited  Result Date: 07/27/2017 ----------------------------------------------------------------------  OBSTETRICS REPORT                      (Signed Final 07/27/2017 05:42 pm) ---------------------------------------------------------------------- Patient Info  ID #:       [id number]                          D.O.B.:  [birth year] (21 yrs)  Name:       Felicia Clark                   Visit Date: 07/27/2017 04:00 pm ---------------------------------------------------------------------- Performed By  Performed By:     Hubert Azure          Ref. Address:     [place]                    [place]                                                             [place] [address]  Attending:        Wende Mott MD     Location:         [facility]  Referred By:      Luna Glasgow CNM ---------------------------------------------------------------------- Orders   #  Description                                 Code   1  [place] MFM OB LIMITED                           [phone]  ----------------------------------------------------------------------   #  Ordered By               Order #        Accession #    Episode #   1  Griffin Dakin              [id number]      [id number]  [id number]  ---------------------------------------------------------------------- Indications   [redacted] weeks gestation of pregnancy                Z3A.18   Fetal abnormality - other known or             O35.9XX0   suspected; cystic hygroma, ascites; Low risk   NIPS, WNL TORCH titers   ---------------------------------------------------------------------- OB History  Gravidity:    1         Term:   0        Prem:   0        SAB:   0  TOP:          0       Ectopic:  0        Living: 0 ---------------------------------------------------------------------- Fetal Evaluation  Num Of Fetuses:     1  Fetal Heart         142  Rate(bpm):  Cardiac Activity:   Observed  Presentation:       Cephalic  Placenta:           Posterior, above cervical os  Amniotic Fluid  AFI FV:      Oligohydramnios                              Largest Pocket(cm)                              1.92 ---------------------------------------------------------------------- Gestational Age  LMP:           17w 4d        Date:  03/26/17                 EDD:   12/31/17  Best:          18w 6d     Det. By:  U/S  (06/30/17)          EDD:   12/22/17 ---------------------------------------------------------------------- Anatomy  Nuchal Fold:           Cystic hygroma         Abdomen:                Ascites  Thoracic:              Pleural effusion ---------------------------------------------------------------------- Cervix Uterus Adnexa  Cervix  Length:           3.07  cm.  Normal appearance by transabdominal scan.  Uterus  No abnormality visualized.  Left Ovary  Within normal limits.  Right Ovary  Within normal limits.  Adnexa:       No abnormality visualized. ---------------------------------------------------------------------- Impression  Indication: 22 yr old G1P0 at [redacted]w[redacted]d with fetal hydrops for  fetal ultrasound.  Findings:  1. Single intrauterine pregnancy with cardiac activity.  2. Posterior placenta.  3. Decreased amniotic fluid volume.  4. Again seen is a large cystic hygroma involving the entire  length of the fetus with severe skin edema.  5. Again seen  is extensive ascites and pleural effusions.  6. The remainder of the anatomy survey is extremely limited  due to hydrops and fetal position.  ---------------------------------------------------------------------- Recommendations  1. Hydrops:  - patient previously counseled  - structurally normal heart on fetal echocardiogram  - TORCH titers negative  - low risk cell free fetal DNA  - reoffered amniocentesis and microarray and [facility]- patient  would now like [employer]  and will return on 3/28 with the father  of the baby to have this test done  - patient would like to continue expectant management and  weekly viability ultrasounds  - understands poor prognosis and high risk of fetal demise ----------------------------------------------------------------------                Wende Mott, MD Electronically Signed Final Report   07/27/2017 05:42 pm ----------------------------------------------------------------------  [place] Mfm Ob Limited  Result Date: 07/23/2017 ----------------------------------------------------------------------  OBSTETRICS REPORT                      (Signed Final 07/23/2017 01:59 pm) ---------------------------------------------------------------------- Patient Info  ID #:       [id number]                          D.O.B.:  May 31, 1995 (21 yrs)  Name:       Felicia Clark                   Visit Date: 07/23/2017 01:34 pm ---------------------------------------------------------------------- Performed By  Performed By:     Rodrigo Ran BS      Ref. Address:     [place]                                                             [place] [address]  Attending:        Griffin Dakin MD         Location:         [facility]  Referred By:      Luna Glasgow CNM ---------------------------------------------------------------------- Orders   #  Description                                 Code   1  [place] MFM OB LIMITED                           [phone]   ----------------------------------------------------------------------   #  Ordered By               Order #        Accession #    Episode #   1  LKGMW NUUVO              [id number]      [id number]     [id number]  ---------------------------------------------------------------------- Indications   [redacted] weeks gestation of pregnancy                Z3A.18   Fetal abnormality - other known or  O35.9XX0   suspected; cystic hygroma, ascites; Low risk   NIPS, WNL TORCH titers   Encounter for other antenatal screening        Z36.2   follow-up  ---------------------------------------------------------------------- OB History  Gravidity:    1         Term:   0        Prem:   0        SAB:   0  TOP:          0       Ectopic:  0        Living: 0 ---------------------------------------------------------------------- Fetal Evaluation  Num Of Fetuses:     1  Fetal Heart         153  Rate(bpm):  Cardiac Activity:   Observed  Presentation:       Cephalic  Placenta:           Posterior  Amniotic Fluid  AFI FV:      Oligohydramnios                              Largest Pocket(cm)                              2.6 ---------------------------------------------------------------------- Gestational Age  LMP:           17w 0d        Date:  03/26/17                 EDD:   12/31/17  Best:          Renae Fickle 2d     Det. By:  U/S  (06/30/17)          EDD:   12/22/17 ---------------------------------------------------------------------- Cervix Uterus Adnexa  Cervix  Length:            3.3  cm.  Normal appearance by transabdominal scan.  Uterus  No abnormality visualized.  Left Ovary  Within normal limits.  Right Ovary  Within normal limits.  Cul De Sac:   No free fluid seen.  Adnexa:       No abnormality visualized. ---------------------------------------------------------------------- Impression  SIUP at 18+2 weeks  Fetal cardiac activity noted  Large cystic hygroma (+ nuchal hygromas) with significant  ascites and very small pleural effusion;  significant  generalized edema; very little fetal movement  Low amniotic fluid volume ---------------------------------------------------------------------- Recommendations  Recheck scan for viability in one week ----------------------------------------------------------------------                 Griffin Dakin, MD Electronically Signed Final Report   07/23/2017 01:59 pm ----------------------------------------------------------------------   Assessment:  Principal Problem:   Fetal demise at 21 weeks Active Problems:   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 Risks of infection, hemorrhage, retained POCs needing D&E were discussed. Analgesia to be given as needed. Offered SW and [place] support, she declined for now. Still considering cytogenetic analysis of fetal tissue.  Will continue close monitoring and offer support during this difficult time.   Verita Schneiders, MD, [place] [facility] [employer], [place]

## 2017-08-19 NOTE — H&P (Signed)
Obstetric History and Physical  Demetric Prest is a 22 y.o. G1P0 with diagnosis of a 21 week intrauterine fetal demise with known large cystic hygroma and hydrops fetalis.  Had negative TORCH titers, no genetic testing done yet.  Patient was seen in the office today; was counseled about IOL vs D&E, details/risks/benefits reviewed in detail. She opted for IOL.  Currently, she has no pain, fevers, bleeding or other concerns. Has a support person with her.  No significantconcerns.  Past Medical History No past medical history on file.   Past Surgical History:  Procedure Laterality Date  . NO PAST SURGERIES     OB History  Gravida Para Term Preterm AB Living  1         0  SAB TAB Ectopic Multiple Live Births               # Outcome Date GA Lbr Len/2nd Weight Sex Delivery Anes PTL Lv  1 Current           Patient denies any other pertinent gynecologic issues.   No current facility-administered medications on file prior to encounter.    Current Outpatient Medications on File Prior to Encounter  Medication Sig Dispense Refill  . acetaminophen (TYLENOL) 325 MG tablet Take 325 mg by mouth every 6 (six) hours as needed.    . Cyanocobalamin (B-12 PO) Take by mouth.     No Known Allergies  Social History:   reports that she has never smoked. She has never used smokeless tobacco. She reports that she does not drink alcohol or use drugs.  No family history on file.  Review of Systems: Pertinent items noted in HPI and remainder of comprehensive ROS otherwise negative.  PHYSICAL EXAM: Blood pressure 127/82, pulse (!) 108, resp. rate 16, last menstrual period 03/26/2017, SpO2 100 %. CONSTITUTIONAL: Well-developed, well-nourished female in no acute distress.  HENT:  Normocephalic, atraumatic, External right and left ear normal. Oropharynx is clear and moist EYES: Conjunctivae and EOM are normal. Pupils are equal, round, and reactive to light. No scleral icterus.  NECK: Normal range of motion,  supple, no masses SKIN: Skin is warm and dry. No rash noted. Not diaphoretic. No erythema. No pallor. NEUROLOGIC: Alert and oriented to person, place, and time. Normal reflexes, muscle tone coordination. No cranial nerve deficit noted. PSYCHIATRIC: Normal mood and affect. Normal behavior. Normal judgment and thought content. CARDIOVASCULAR: Normal heart rate noted, regular rhythm RESPIRATORY: Effort and breath sounds normal, no problems with respiration noted ABDOMEN: Soft, gravid, nontender, nondistended. PELVIC: Deferred MUSCULOSKELETAL: Normal range of motion. No edema and no tenderness. 2+ distal pulses.  Labs: Results for orders placed or performed during the hospital encounter of 08/19/17 (from the past 336 hour(s))  CBC   Collection Time: 08/19/17  4:20 PM  Result Value Ref Range   WBC 7.3 4.0 - 10.5 K/uL   RBC 4.25 3.87 - 5.11 MIL/uL   Hemoglobin 12.9 12.0 - 15.0 g/dL   HCT 37.1 36.0 - 46.0 %   MCV 87.3 78.0 - 100.0 fL   MCH 30.4 26.0 - 34.0 pg   MCHC 34.8 30.0 - 36.0 g/dL   RDW 13.2 11.5 - 15.5 %   Platelets 359 150 - 400 K/uL    Imaging Studies: [place] Mfm Ob Limited  Result Date: 08/11/2017 ----------------------------------------------------------------------  OBSTETRICS REPORT                      (Signed Final 08/11/2017 04:12 pm) ---------------------------------------------------------------------- Patient Info  ID #:       [id number]                          D.O.B.:  03/13/1996 (21 yrs)  Name:       Felicia Clark                   Visit Date: 08/11/2017 03:12 pm ---------------------------------------------------------------------- Performed By  Performed By:     Felecia Jan        Ref. Address:     [address]                    [place]                                                             [place] [address]  Attending:        Jolyn Lent MD      Location:         [facility]  Referred By:       Luna Glasgow CNM ---------------------------------------------------------------------- Orders   #  Description                                 Code   1  [place] MFM OB LIMITED                           [phone]  ----------------------------------------------------------------------   #  Ordered By               Order #        Accession #    Episode #   1  [place]            [id number]      [id number]     [id number]  ---------------------------------------------------------------------- Indications   [redacted] weeks gestation of pregnancy                Z3A.21   Fetal abnormality - other known or             O35.9XX0   suspected; cystic hygroma, ascites; Low risk   NIPS, WNL TORCH titers   Fetal demise less than 22 weeks                O02.1  ---------------------------------------------------------------------- OB History  Gravidity:    1         Term:   0        Prem:   0        SAB:   0  TOP:          0       Ectopic:  0  Living: 0 ---------------------------------------------------------------------- Fetal Evaluation  Num Of Fetuses:     1  Cardiac Activity:   Absent  Presentation:       Cephalic  Amniotic Fluid  AFI FV:      Subjectively within normal limits                              Largest Pocket(cm)                              4.3 ---------------------------------------------------------------------- Gestational Age  LMP:           19w 5d        Date:  03/26/17                 EDD:   12/31/17  Best:          Audrea Muscat 0d     Det. By:  U/S  (06/30/17)          EDD:   12/22/17 ---------------------------------------------------------------------- Cervix Uterus Adnexa  Cervix  Length:            3.7  cm. ---------------------------------------------------------------------- Comments  No fetal heart activity was noted on today's scan indicating an  intrauterine fetal demise, which I discussed with Ms. Guinther.  We also discussed management options including IOL vs.  D&E.  She is uncertain  how she wishes to proceed and would  like to discuss the matter further with the FOB and her family.  She will make an appointment in the high risk clinic at a time  when the FOB can be available to discuss next steps with her  primary team. ---------------------------------------------------------------------- Impression  Intrauterine fetal demise at 21 weeks 0 days.  Known cystic hygrom and hydrops fetalis. ---------------------------------------------------------------------- Recommendations  See comments. ----------------------------------------------------------------------                 Jolyn Lent, MD Electronically Signed Final Report   08/11/2017 04:12 pm ----------------------------------------------------------------------  [place] Mfm Ob Limited  Result Date: 08/03/2017 ----------------------------------------------------------------------  OBSTETRICS REPORT                      (Signed Final 08/03/2017 11:31 am) ---------------------------------------------------------------------- Patient Info  ID #:       [id number]                          D.O.B.:  [birth year] (21 yrs)  Name:       Felicia Clark                   Visit Date: 08/03/2017 11:08 am ---------------------------------------------------------------------- Performed By  Performed By:     Rodrigo Ran BS      Ref. Address:     [place]  [place] [address]  Attending:        Griffin Dakin MD         Location:         [facility]  Referred By:      Luna Glasgow CNM ---------------------------------------------------------------------- Orders   #  Description                                 Code   1  [place] MFM OB LIMITED                           [id number]  ----------------------------------------------------------------------   #  Ordered By                Order #        Accession #    Episode #   1  [place]            [id number]      [id number]     [id number]  ---------------------------------------------------------------------- Indications   [redacted] weeks gestation of pregnancy                Z3A.19   Fetal abnormality - other known or             O35.9XX0   suspected; cystic hygroma, ascites; Low risk   NIPS, WNL TORCH titers  ---------------------------------------------------------------------- OB History  Gravidity:    1         Term:   0        Prem:   0        SAB:   0  TOP:          0       Ectopic:  0        Living: 0 ---------------------------------------------------------------------- Fetal Evaluation  Num Of Fetuses:     1  Fetal Heart         132  Rate(bpm):  Cardiac Activity:   Observed  Presentation:       Cephalic  Placenta:           Posterior, above cervical os  P. Cord Insertion:  Previously Visualized  Amniotic Fluid  AFI FV:      Oligohydramnios                              Largest Pocket(cm)                              1.92 ---------------------------------------------------------------------- Gestational Age  LMP:           18w 4d        Date:  03/26/17                 EDD:   12/31/17  Best:          19w 6d     Det. By:  U/S  (06/30/17)          EDD:   12/22/17 ---------------------------------------------------------------------- Cervix Uterus Adnexa  Cervix  Length:           3.07  cm.  Normal appearance by transabdominal scan.  Uterus  No abnormality visualized.  Left Ovary  Within  normal limits.  Right Ovary  Within normal limits.  Cul De Sac:   No free fluid seen.  Adnexa:       No abnormality visualized. ---------------------------------------------------------------------- Impression  SIUP at 19+6 weeks  Fetal cardiac activity noted  Large cystic hygroma (+ nuchal hygromas) with significant  ascites and very small pleural effusion; significant  generalized edema; very little fetal movement  Low amniotic fluid volume  ---------------------------------------------------------------------- Recommendations  Recheck scan for viability in one week ----------------------------------------------------------------------                 Griffin Dakin, MD Electronically Signed Final Report   08/03/2017 11:31 am ----------------------------------------------------------------------  [place] Mfm Ob Limited  Result Date: 07/27/2017 ----------------------------------------------------------------------  OBSTETRICS REPORT                      (Signed Final 07/27/2017 05:42 pm) ---------------------------------------------------------------------- Patient Info  ID #:       [id number]                          D.O.B.:  [birth year] (21 yrs)  Name:       Felicia Clark                   Visit Date: 07/27/2017 04:00 pm ---------------------------------------------------------------------- Performed By  Performed By:     Hubert Azure          Ref. Address:     [address]                    [place]                                                             [place] [address]  Attending:        Wende Mott MD     Location:         [facility]  Referred By:      Luna Glasgow CNM ---------------------------------------------------------------------- Orders   #  Description                                 Code   1  [place] MFM OB LIMITED                           [phone]  ----------------------------------------------------------------------   #  Ordered By               Order #        Accession #    Episode #   1  Griffin Dakin              [id number]      [id number]  [id number]  ---------------------------------------------------------------------- Indications   [redacted] weeks gestation of pregnancy                Z3A.18   Fetal abnormality - other known or             O35.9XX0   suspected; cystic hygroma, ascites; Low risk   NIPS, WNL TORCH titers   ---------------------------------------------------------------------- OB History  Gravidity:    1         Term:   0        Prem:   0        SAB:   0  TOP:          0       Ectopic:  0        Living: 0 ---------------------------------------------------------------------- Fetal Evaluation  Num Of Fetuses:     1  Fetal Heart         142  Rate(bpm):  Cardiac Activity:   Observed  Presentation:       Cephalic  Placenta:           Posterior, above cervical os  Amniotic Fluid  AFI FV:      Oligohydramnios                              Largest Pocket(cm)                              1.92 ---------------------------------------------------------------------- Gestational Age  LMP:           17w 4d        Date:  03/26/17                 EDD:   12/31/17  Best:          18w 6d     Det. By:  U/S  (06/30/17)          EDD:   12/22/17 ---------------------------------------------------------------------- Anatomy  Nuchal Fold:           Cystic hygroma         Abdomen:                Ascites  Thoracic:              Pleural effusion ---------------------------------------------------------------------- Cervix Uterus Adnexa  Cervix  Length:           3.07  cm.  Normal appearance by transabdominal scan.  Uterus  No abnormality visualized.  Left Ovary  Within normal limits.  Right Ovary  Within normal limits.  Adnexa:       No abnormality visualized. ---------------------------------------------------------------------- Impression  Indication: 22 yr old G1P0 at [redacted]w[redacted]d with fetal hydrops for  fetal ultrasound.  Findings:  1. Single intrauterine pregnancy with cardiac activity.  2. Posterior placenta.  3. Decreased amniotic fluid volume.  4. Again seen is a large cystic hygroma involving the entire  length of the fetus with severe skin edema.  5. Again seen  is extensive ascites and pleural effusions.  6. The remainder of the anatomy survey is extremely limited  due to hydrops and fetal position.  ---------------------------------------------------------------------- Recommendations  1. Hydrops:  - patient previously counseled  - structurally normal heart on fetal echocardiogram  - TORCH titers negative  - low risk cell free fetal DNA  - reoffered amniocentesis and microarray and [facility]- patient  would now like [employer]  and will return on 3/28 with the father  of the baby to have this test done  - patient would like to continue expectant management and  weekly viability ultrasounds  - understands poor prognosis and high risk of fetal demise ----------------------------------------------------------------------                Wende Mott, MD Electronically Signed Final Report   07/27/2017 05:42 pm ----------------------------------------------------------------------  [place] Mfm Ob Limited  Result Date: 07/23/2017 ----------------------------------------------------------------------  OBSTETRICS REPORT                      (Signed Final 07/23/2017 01:59 pm) ---------------------------------------------------------------------- Patient Info  ID #:       [id number]                          D.O.B.:  02/18/1996 (21 yrs)  Name:       Felicia Clark                   Visit Date: 07/23/2017 01:34 pm ---------------------------------------------------------------------- Performed By  Performed By:     Rodrigo Ran BS      Ref. Address:     [address]                                                             [place] [address]  Attending:        Griffin Dakin MD         Location:         [facility]  Referred By:      Luna Glasgow CNM ---------------------------------------------------------------------- Orders   #  Description                                 Code   1  [place] MFM OB LIMITED                           [phone]   ----------------------------------------------------------------------   #  Ordered By               Order #        Accession #    Episode #   2  IOXBD ZHGDJ              [id number]      [id number]     [id number]  ---------------------------------------------------------------------- Indications   [redacted] weeks gestation of pregnancy                Z3A.18   Fetal abnormality - other known or  O35.9XX0   suspected; cystic hygroma, ascites; Low risk   NIPS, WNL TORCH titers   Encounter for other antenatal screening        Z36.2   follow-up  ---------------------------------------------------------------------- OB History  Gravidity:    1         Term:   0        Prem:   0        SAB:   0  TOP:          0       Ectopic:  0        Living: 0 ---------------------------------------------------------------------- Fetal Evaluation  Num Of Fetuses:     1  Fetal Heart         153  Rate(bpm):  Cardiac Activity:   Observed  Presentation:       Cephalic  Placenta:           Posterior  Amniotic Fluid  AFI FV:      Oligohydramnios                              Largest Pocket(cm)                              2.6 ---------------------------------------------------------------------- Gestational Age  LMP:           17w 0d        Date:  03/26/17                 EDD:   12/31/17  Best:          Renae Fickle 2d     Det. By:  U/S  (06/30/17)          EDD:   12/22/17 ---------------------------------------------------------------------- Cervix Uterus Adnexa  Cervix  Length:            3.3  cm.  Normal appearance by transabdominal scan.  Uterus  No abnormality visualized.  Left Ovary  Within normal limits.  Right Ovary  Within normal limits.  Cul De Sac:   No free fluid seen.  Adnexa:       No abnormality visualized. ---------------------------------------------------------------------- Impression  SIUP at 18+2 weeks  Fetal cardiac activity noted  Large cystic hygroma (+ nuchal hygromas) with significant  ascites and very small pleural effusion;  significant  generalized edema; very little fetal movement  Low amniotic fluid volume ---------------------------------------------------------------------- Recommendations  Recheck scan for viability in one week ----------------------------------------------------------------------                 Griffin Dakin, MD Electronically Signed Final Report   07/23/2017 01:59 pm ----------------------------------------------------------------------   Assessment:  Principal Problem:   Fetal demise at 21 weeks Active Problems:   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 Risks of infection, hemorrhage, retained POCs needing D&E were discussed. Analgesia to be given as needed. Offered SW and [place] support, she declined for now. Still considering cytogenetic analysis of fetal tissue.  Will continue close monitoring and offer support during this difficult time.   Verita Schneiders, MD, [place] for [employer], [place]

## 2017-08-20 DIAGNOSIS — O402XX0 Polyhydramnios, second trimester, not applicable or unspecified: Secondary | ICD-10-CM

## 2017-08-20 DIAGNOSIS — O364XX0 Maternal care for intrauterine death, not applicable or unspecified: Secondary | ICD-10-CM

## 2017-08-20 DIAGNOSIS — O411230 Chorioamnionitis, third trimester, not applicable or unspecified: Secondary | ICD-10-CM

## 2017-08-20 DIAGNOSIS — Z3A22 22 weeks gestation of pregnancy: Secondary | ICD-10-CM

## 2017-08-20 LAB — RPR: RPR Ser Ql: NONREACTIVE

## 2017-08-20 MED ORDER — AZITHROMYCIN 250 MG PO TABS
1000.0000 mg | ORAL_TABLET | Freq: Once | ORAL | Status: AC
  Administered 2017-08-20: 1000 mg via ORAL
  Filled 2017-08-20: qty 4

## 2017-08-20 MED ORDER — KETOROLAC TROMETHAMINE 30 MG/ML IJ SOLN
30.0000 mg | Freq: Four times a day (QID) | INTRAMUSCULAR | Status: DC
  Administered 2017-08-20 – 2017-08-21 (×5): 30 mg via INTRAVENOUS
  Filled 2017-08-20 (×5): qty 1

## 2017-08-20 MED ORDER — PIPERACILLIN-TAZOBACTAM 3.375 G IVPB
3.3750 g | Freq: Three times a day (TID) | INTRAVENOUS | Status: DC
  Administered 2017-08-20 – 2017-08-21 (×4): 3.375 g via INTRAVENOUS
  Filled 2017-08-20 (×4): qty 50

## 2017-08-20 MED ORDER — PIPERACILLIN-TAZOBACTAM 3.375 G IVPB 30 MIN: 3.3750 g | Freq: Three times a day (TID) | INTRAVENOUS | Status: DC

## 2017-08-20 MED ORDER — MISOPROSTOL 200 MCG PO TABS
200.0000 ug | ORAL_TABLET | Freq: Once | ORAL | Status: AC
  Administered 2017-08-20: 200 ug via BUCCAL
  Filled 2017-08-20: qty 1

## 2017-08-20 NOTE — Progress Notes (Signed)
CSW received consult due to IUFD.  CSW available for support secondary to [facility] and will await call from [place] before becoming involved.  CSW screening out referral at this time.

## 2017-08-20 NOTE — Plan of Care (Signed)
Pt. Appropriate for situation. Pain well under control with PRN and scheduled meds. Family support noted. Bleeding minimal VSS. Will continue to monitor and support through the PM shift.

## 2017-08-20 NOTE — Progress Notes (Signed)
I introduced spiritual care services to pt and her family.  They reported that they are coping okay and would not like to speak at this time.    [place], [place] Pager, [phone] 2:56 PM

## 2017-08-20 NOTE — Progress Notes (Signed)
Faculty Practice OB/GYN Attending Note  Subjective:  Called to evaluate patient with fever of 102.2 despite administration of Tylenol after earlier fever of  101.1 F. She is s/p delivery of 21 week IUFD with fetal anomalies around 0300 after IOL with high dose misoprostol.  Placenta is still undelivered, no significant bleeding noted. Patient denies any concerns. No N/V, dysuria or other GI symptoms.   Objective:  Blood pressure 110/70, pulse 95, temperature (!) 102.2 F (39 C), temperature source Oral, resp. rate 16, height 5' (1.524 m), weight 108 lb (49 kg), last menstrual period 03/26/2017, SpO2 99 %.  Patient Vitals for the past 24 hrs:  BP Temp Temp src Pulse Resp SpO2 Height Weight  08/20/17 0733 102/73 - - (!) 108 16 97 % - -  08/20/17 0645 - (!) 102.2 F (39 C) Oral - - - - -  08/20/17 0530 110/70 (!) 101.1 F (38.4 C) Oral 95 16 99 % - -  08/20/17 0142 117/77 99.7 F (37.6 C) Oral (!) 106 16 94 % - -  08/19/17 2119 - - - - - - 5' (1.524 m) 108 lb (49 kg)  08/19/17 2036 110/68 98.9 F (37.2 C) Oral 89 18 100 % - -  08/19/17 1821 - - - - - - - 108 lb (49 kg)  08/19/17 1619 127/82 (!) 97.5 F (36.4 C) Oral (!) 108 16 100 % - -  Gen: NAD HENT: Normocephalic, atraumatic Lungs: Normal respiratory effort Heart: Tachycardia noted Abdomen: NT, fundus below umbilicus, soft Ext: 2+ DTRs, no edema, no cyanosis, negative Homan's sign  Delivery of Placenta Bimanual exam revealed placenta in lower uterine segment/cervix. After having patient push and also helping manually, the placenta was delivered around 0720. Relatively intact.  No other fragments palpated in cervix or lower uterine segment. Had about 200 ml in clots also expressed during this time.   Assessment & Plan:  22 y.o. G1P0100 s/p SVD of 21 week IUFD with known fetal anomalies (large cystic hygroma and hydrops fetalis).  Has high fevers, which could be secondary to high dose misoprostol and also concerning for intrauterine  infections. Antibiotics (Zosyn IV and one dose of Azithromycin 1g po) ordered; will continue Zosyn until at least 24 hours afebrile. Patient agreed with this plan.  She still declines SW or chaplain services; has two support people in room with her.  She decided she wants a fetopsy done.  Continue close observation.   Verita Schneiders, MD, [place] for [employer], [place]

## 2017-08-21 MED ORDER — DOXYCYCLINE HYCLATE 100 MG PO CAPS: 100.0000 mg | ORAL_CAPSULE | Freq: Two times a day (BID) | ORAL | 0 refills | Status: DC

## 2017-08-21 MED ORDER — IBUPROFEN 600 MG PO TABS: 600.0000 mg | ORAL_TABLET | Freq: Four times a day (QID) | ORAL | 1 refills | Status: DC | PRN

## 2017-08-21 NOTE — Discharge Summary (Signed)
       OB Discharge Summary  Patient Name: Felicia Clark DOB: 1995-08-04 MRN: [id number]  Date of admission: 08/19/2017 Delivering MD: This patient has no babies on file.  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 yo G1P0 at [redacted]w[redacted]d was admitted to the hospital 08/19/2017 for induction of labor.  Indication for induction: IUFD.  Patient had an uncomplicated labor course as follows: cytotec x 2 dose, then SROM and Delivery. Placenta retrieved several hours later. Had fever at delivery of placenta and had IV Abx x 24 hours. Afebrile x 24 hours, minimal lochia, sent home on Antibiotics Patient had delivery of a Non Viable infant.  Patient is discharged home 08/21/17.   Physical exam  Vitals:   08/20/17 1927 08/21/17 0033 08/21/17 0356 08/21/17 0742  BP: 95/61 (!) 88/59 98/63 101/66  Pulse: 80 69 73 73  Resp: 18 18 18 16   Temp: 98 F (36.7 C) 99.6 F (37.6 C) 98 F (36.7 C) 98.9 F (37.2 C)  TempSrc: Oral Oral Oral Oral  SpO2: 100% 98% 99% 100%  Weight:      Height:       General: alert, cooperative and no distress Lochia: appropriate Uterine Fundus: firm, mildly tender DVT Evaluation: No evidence of DVT seen on physical exam. Labs: Lab Results  Component Value Date   WBC 7.3 08/19/2017   HGB 12.9 08/19/2017   HCT 37.1 08/19/2017   MCV 87.3 08/19/2017   PLT 359 08/19/2017   No flowsheet data  found.  Discharge instruction: per After Visit Summary and "Baby and Me Booklet".  After Visit Meds:  Allergies as of 08/21/2017   No Known Allergies     Medication List    TAKE these medications   acetaminophen 325 MG tablet Commonly known as:  TYLENOL Take 325 mg by mouth every 6 (six) hours as needed.   B-12 PO Take by mouth.   doxycycline 100 MG capsule Commonly known as:  VIBRAMYCIN Take 1 capsule (100 mg total) by mouth 2 (two) times daily.   ibuprofen 600 MG tablet Commonly known as:  ADVIL,MOTRIN Take 1 tablet (600 mg total) by mouth every 6 (six) hours as needed.       Diet: routine diet  Activity: Advance as tolerated. Pelvic rest for 6 weeks.   Outpatient follow up:2 weeks Follow up Appt:No future appointments. Follow up visit: No follow-ups on file.  Postpartum contraception: Undecided  Newborn Data: Disposition:morgue   08/21/2017 Donnamae Jude, MD

## 2017-08-21 NOTE — Discharge Instructions (Signed)
Vaginal Delivery, Care After °Refer to this sheet in the next few weeks. These instructions provide you with information on caring for yourself after your delivery. Your health care provider may also give you more specific instructions. Your treatment has been planned according to current medical practices, but problems sometimes occur. Call your health care provider if you have any problems or questions after your delivery. °What to expect after your delivery °After your delivery, it is typical to have the following: °· You may feel pain in the vaginal area for several days after delivery. If you had an incision or a vaginal tear, the area will probably continue to be tender to the touch for several weeks. °· You may feel very fatigued after a vaginal delivery. °· You may have vaginal bleeding and discharge that will start out red, then become pink, then yellow, then white. Altogether, this usually lasts for about 6 weeks. °· The combination of having lost your baby and changing hormones from the delivery can make you feel very sad. You may also experience emotions that change very quickly. Some of the emotions people often notice after loss include: °? Anger. °? Denial. °? Guilt. °? Sorrow. °? Depression. °? Grief. °? Relationship problems. ° °Follow these instructions at home: °· Consider seeking support for your loss. Some forms of support that you might consider include your religious leader, friends, family, a professional counselor, or a bereavement support group. °· Take medicines only as directed by your health care provider. °· Continue to use good perineal care. Good perineal care includes: °? Wiping your perineum from front to back. °? Keeping your perineum clean. °· Do not use tampons or douche until your health care provider says it is okay. °· Shower, wash your hair, and take tub baths as directed by your health care provider. °· Wear a well-fitting bra that provides breast support. °· Drink enough  fluids to keep your urine clear or pale yellow. °· Eat healthy foods. °· Eat high-fiber foods every day, such as whole grain cereals and breads, brown rice, beans, and fresh fruits and vegetables. These foods may help prevent or relieve constipation. °· Follow your health care provider's directions about resuming activities such as climbing stairs, driving, lifting, exercising, or traveling. °· Increase your activities gradually. °· Talk to your health care provider about resuming sexual activities. This depends on your risk of infection, your rate of healing, and your comfort and desire to resume sexual activity. °· Try to have someone help you with your household activities for at least a few days after you leave the hospital. °· Rest as much as possible. °· Keep all of your scheduled postpartum appointments. It is very important to keep your scheduled follow-up appointments. At these appointments, your health care provider will be checking to make sure that you are healing physically and emotionally. °· Do not drink alcohol, especially if you are taking medicine to relieve pain. °· Do not use any tobacco products including cigarettes, chewing tobacco, or electronic cigarettes. If you need help quitting, ask your health care provider. °· Do not use illegal drugs. °Contact a health care provider if: °· You feel sad or depressed. °· You have thoughts of hurting yourself. °· You are having trouble eating or sleeping. °· You cannot enjoy the things in life you have previously enjoyed. °· You are passing large clots from your vagina. Save any clots to show your health care provider. °· You have a bad smelling discharge from your vagina. °·   You have trouble urinating. °· You are urinating frequently. °· You have pain when you urinate. °· You have a change in your bowel movements. °· You have increasing redness, pain, or swelling near your incision or vaginal tear. °· You have pus draining from your incision or vaginal  tear. °· Your incision or vaginal tear is separating. °· You have painful, hard, or reddened breasts. °· You have a severe headache. °· You have blurred vision or see spots. °· You are dizzy or light-headed. °· You have a rash. °· You have nausea or vomiting. °· You have not had a menstrual period by the 12th week after delivery. °· You have a fever. °Get help right away if: °· You are concerned that you may hurt yourself or you are considering suicide. °· You have persistent pain. °· You have chest pain. °· You have shortness of breath. °· You faint. °· You have leg pain. °· You have stomach pain. °· Your vaginal bleeding saturates two or more sanitary pads in 1 hour. °This information is not intended to replace advice given to you by your health care provider. Make sure you discuss any questions you have with your health care provider. °Document Released: 09/04/2013 Document Revised: 09/26/2015 Document Reviewed: 06/08/2013 °Elsevier Interactive Patient Education © 2018 [employer] ° °

## 2017-08-24 ENCOUNTER — Encounter (HOSPITAL_COMMUNITY): Payer: Self-pay

## 2017-08-24 ENCOUNTER — Ambulatory Visit (HOSPITAL_COMMUNITY): Payer: Medicaid Other

## 2017-09-06 ENCOUNTER — Ambulatory Visit: Payer: Medicaid Other | Admitting: Clinical

## 2017-09-06 ENCOUNTER — Encounter: Payer: Self-pay | Admitting: Family Medicine

## 2017-09-06 ENCOUNTER — Ambulatory Visit (INDEPENDENT_AMBULATORY_CARE_PROVIDER_SITE_OTHER): Payer: Medicaid Other | Admitting: Family Medicine

## 2017-09-06 NOTE — BH Specialist Note (Signed)
Error, pt declined visit

## 2017-09-06 NOTE — Progress Notes (Signed)
Subjective:     Felicia Clark is a 22 y.o. female who presents for a postpartum visit. She is 2 weeks postpartum following a spontaneous vaginal delivery. I have fully reviewed the prenatal and intrapartum course. The delivery was at 22 gestational weeks. Outcome: spontaneous vaginal delivery. Postpartum course has been normal. Bleeding no bleeding. Bowel function is normal. Bladder function is normal. Patient is not sexually active. Contraception method is none. Postpartum depression screening: negative.  The following portions of the patient's history were reviewed and updated as appropriate: allergies, current medications, past family history, past medical history, past social history, past surgical history and problem list.  Review of Systems Pertinent items noted in HPI and remainder of comprehensive ROS otherwise negative.   Objective:    LMP 03/26/2017 (Approximate)   General:  alert, cooperative and appears stated age  Lungs: normal effort  Heart:  regular rate and rhythm  Abdomen: soft, non-tender; bowel sounds normal; no masses,  no organomegaly        Assessment:    Normal postpartum exam. Pap smear not done at today's visit.   Plan:    1. Contraception: none may use condoms. We are here to help with any contraceptive needs she has. 2. Advised of normal grieving process--we are here and available to help with anything she needs. 3. Follow up in: 1 year or as needed.

## 2017-09-07 ENCOUNTER — Encounter: Payer: Self-pay | Admitting: Family Medicine

## 2018-03-02 DIAGNOSIS — Z3200 Encounter for pregnancy test, result unknown: Secondary | ICD-10-CM | POA: Diagnosis not present

## 2018-03-02 DIAGNOSIS — Z3009 Encounter for other general counseling and advice on contraception: Secondary | ICD-10-CM | POA: Diagnosis not present

## 2018-03-16 ENCOUNTER — Encounter (HOSPITAL_COMMUNITY): Payer: Self-pay

## 2018-07-28 NOTE — Telephone Encounter (Signed)
Opened in error

## 2019-01-25 DIAGNOSIS — Z23 Encounter for immunization: Secondary | ICD-10-CM | POA: Diagnosis not present

## 2019-05-05 NOTE — L&D Delivery Note (Signed)
Delivery Note At 9:04 AM a viable female was delivered via Vaginal, Spontaneous (Presentation: Left Occiput Anterior).  APGAR:8,8; weight 2820g.   Placenta status: Spontaneous, Intact.  Cord: 3 vessels with the following complications: None.   Anesthesia: 10cc local lidocaine 1%  Episiotomy: none Lacerations: left sulcal, repaired Suture Repair: 2.0 vicryl Est. Blood Loss (mL): 17mL  Mom to postpartum.  Baby to Couplet care / Skin to Skin.  Delivery: Called to room and patient was complete and pushing. Head delivered LOA. Nuchal cord present x2. Shoulder and body delivered in usual fashion. Infant with spontaneous cry, placed on mother's abdomen, dried and stimulated. Cord clamped x immediately, and cut by myself given delayed cry. Placenta delivered spontaneously with gentle cord traction. Fundus firm with massage and Pitocin. Labia, perineum, vagina, and cervix were inspected, as noted above.    Arrie Senate 0/07/1279, 9:21 AM

## 2019-06-01 ENCOUNTER — Encounter: Payer: Self-pay | Admitting: General Practice

## 2019-06-01 ENCOUNTER — Other Ambulatory Visit: Payer: Self-pay

## 2019-06-01 ENCOUNTER — Ambulatory Visit (INDEPENDENT_AMBULATORY_CARE_PROVIDER_SITE_OTHER): Payer: Medicaid Other | Admitting: General Practice

## 2019-06-01 ENCOUNTER — Encounter: Payer: Self-pay | Admitting: Family Medicine

## 2019-06-01 DIAGNOSIS — Z3201 Encounter for pregnancy test, result positive: Secondary | ICD-10-CM | POA: Diagnosis not present

## 2019-06-01 LAB — POCT PREGNANCY, URINE: Preg Test, Ur: POSITIVE — AB

## 2019-06-01 NOTE — Progress Notes (Signed)
Patient presents to office today for UPT. UPT +. Patient reports first + UPT at home 05/12/19. LMP 04/06/19 EDD 01/11/20 8 weeks. She reports taking a PNV daily. Patient does report history of 22 week stillborn in 2019. Patient will check out to schedule initial prenatal visit.   Koren Bound RN BSN 06/01/19

## 2019-06-06 NOTE — Progress Notes (Signed)
Patient seen and assessed by nursing staff during this encounter. I have reviewed the chart and agree with the documentation and plan.  Mora Bellman, MD 06/06/2019 10:23 AM

## 2019-06-13 ENCOUNTER — Other Ambulatory Visit: Payer: Self-pay

## 2019-06-13 ENCOUNTER — Ambulatory Visit (INDEPENDENT_AMBULATORY_CARE_PROVIDER_SITE_OTHER): Payer: Medicaid Other | Admitting: *Deleted

## 2019-06-13 DIAGNOSIS — O0990 Supervision of high risk pregnancy, unspecified, unspecified trimester: Secondary | ICD-10-CM | POA: Insufficient documentation

## 2019-06-13 DIAGNOSIS — Z8759 Personal history of other complications of pregnancy, childbirth and the puerperium: Secondary | ICD-10-CM | POA: Insufficient documentation

## 2019-06-13 NOTE — Patient Instructions (Signed)

## 2019-06-13 NOTE — Progress Notes (Signed)
I connected with  Felicia Clark on 06/13/19 at  3:30 PM EST by telephone and verified that I am speaking with the correct person using two identifiers.   I discussed the limitations, risks, security and privacy concerns of performing an evaluation and management service by telephone and the availability of in person appointments. I also discussed with the patient that there may be a patient responsible charge related to this service. The patient expressed understanding and agreed to proceed.  I explained I am completing Felicia Clark New OB Intake today. We discussed Her EDD and that it is based on  sure LMP . I reviewed Felicia Clark allergies, meds, OB History, Medical /Surgical history, and appropriate screenings. I informed Felicia Clark of [facility] services.  I explained I will send Felicia Clark the [employer] app- app sent to Felicia Clark while on phone- she did not receive. We discussed this may be because she had [employer] in previous pregnancy and I will contact [employer] to resend the email. I asked Felicia Clark to check again later today or tomorrow. .  I explained we will have Felicia Clark take Felicia Clark  blood pressure weekly and she confirms she already has a blood pressure  cuff at home . Explained  then we will have Felicia Clark take Felicia Clark blood pressure weekly and enter into the app. I explained she will have some visits in office and some virtually. She already has MyChart and I attempted assisting Felicia Clark with downloading app- she will try at home- currently she is not at home and doesn't have good wifi. I reviewed Felicia Clark new ob  appointment date/ time with Felicia Clark , our location and to wear mask, no visitors.  I explained she will have a pelvic exam, ob bloodwork, hemoglobin a1C, cbg , and  genetic testing if desired,- she is undecided about  a panorama. I scheduled an [place] at 19 weeks and gave Felicia Clark the appointment. She voices understanding.  Koty Anctil,RN 06/13/2019  3:27 PM

## 2019-06-14 NOTE — Addendum Note (Signed)
Addended by: Samuel Germany on: 06/14/2019 02:35 PM   Modules accepted: Orders

## 2019-06-14 NOTE — Progress Notes (Signed)
Patient assessed by nursing staff during this encounter. I have reviewed the chart and agree with the documentation and plan.  Verita Schneiders, MD 06/14/2019 9:06 AM

## 2019-06-28 ENCOUNTER — Other Ambulatory Visit (HOSPITAL_COMMUNITY)
Admission: RE | Admit: 2019-06-28 | Discharge: 2019-06-28 | Disposition: A | Discharge status: Home / Self Care | Payer: Medicaid Other | Source: Ambulatory Visit | Attending: Obstetrics & Gynecology | Admitting: Obstetrics & Gynecology

## 2019-06-28 ENCOUNTER — Other Ambulatory Visit: Payer: Self-pay

## 2019-06-28 ENCOUNTER — Ambulatory Visit (INDEPENDENT_AMBULATORY_CARE_PROVIDER_SITE_OTHER): Payer: Medicaid Other | Admitting: Obstetrics & Gynecology

## 2019-06-28 VITALS — BP 116/77 | HR 91 | Wt 104.3 lb

## 2019-06-28 DIAGNOSIS — Z3A11 11 weeks gestation of pregnancy: Secondary | ICD-10-CM | POA: Diagnosis not present

## 2019-06-28 DIAGNOSIS — O0990 Supervision of high risk pregnancy, unspecified, unspecified trimester: Secondary | ICD-10-CM | POA: Insufficient documentation

## 2019-06-28 DIAGNOSIS — O09299 Supervision of pregnancy with other poor reproductive or obstetric history, unspecified trimester: Secondary | ICD-10-CM | POA: Insufficient documentation

## 2019-06-28 DIAGNOSIS — O219 Vomiting of pregnancy, unspecified: Secondary | ICD-10-CM | POA: Diagnosis not present

## 2019-06-28 DIAGNOSIS — O09291 Supervision of pregnancy with other poor reproductive or obstetric history, first trimester: Secondary | ICD-10-CM | POA: Diagnosis not present

## 2019-06-28 LAB — POCT URINALYSIS DIP (DEVICE)
Bilirubin Urine: NEGATIVE
Glucose, UA: NEGATIVE mg/dL
Hgb urine dipstick: NEGATIVE
Ketones, ur: NEGATIVE mg/dL
Nitrite: NEGATIVE
Protein, ur: NEGATIVE mg/dL
Specific Gravity, Urine: 1.025 (ref 1.005–1.030)
Urobilinogen, UA: 0.2 mg/dL (ref 0.0–1.0)
pH: 7 (ref 5.0–8.0)

## 2019-06-28 MED ORDER — ONDANSETRON 4 MG PO TBDP: 4.0000 mg | ORAL_TABLET | Freq: Four times a day (QID) | ORAL | 3 refills | Status: DC | PRN

## 2019-06-28 MED ORDER — PROMETHAZINE HCL 25 MG PO TABS: 25.0000 mg | ORAL_TABLET | Freq: Four times a day (QID) | ORAL | 2 refills | Status: DC | PRN

## 2019-06-28 NOTE — Progress Notes (Signed)
History:   Felicia Clark is a 24 y.o. G2P0100 at [redacted]w[redacted]d by LMP being seen today for her first obstetrical visit.  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 Had negative evaluation afterwards including chromosomal studies as per patient.  Patient does intend to breast feed. Pregnancy history fully reviewed.  Patient reports nausea and vomiting, desires medication.      HISTORY: OB History  Gravida Para Term Preterm AB Living  2 1 0 1 0 0  SAB TAB Ectopic Multiple Live Births  0 0 0 0 0    # Outcome Date GA Lbr Len/2nd Weight Sex Delivery Anes PTL Lv  2 Current           1 Preterm 2019 [redacted]w[redacted]d       FD     Birth Comments: cystic hygroma and hydrops fetalis     Last pap smear was done 06/03/2017 and was normal  No past medical history on file. Past Surgical History:  Procedure Laterality Date  . NO PAST SURGERIES     Family History  Problem Relation Age of Onset  . Fibroids Mother    Social History   Tobacco Use  . Smoking status: Never Smoker  . Smokeless tobacco: Never Used  Substance Use Topics  . Alcohol use: No  . Drug use: No   No Known Allergies Current Outpatient Medications on File Prior to Visit  Medication Sig Dispense Refill  . acetaminophen (TYLENOL) 325 MG tablet Take 325 mg by mouth every 6 (six) hours as needed.    . Prenatal Vit-Fe Fumarate-FA (PRENATAL VITAMINS PO) Take 1 tablet by mouth daily.     No current facility-administered medications on file prior to visit.    Review of Systems Pertinent items noted in HPI and remainder of comprehensive ROS otherwise negative. Physical Exam:   Vitals:   06/28/19 1501  BP: 116/77  Pulse: 91  Weight: 104 lb 4.8 oz (47.3 kg)   Fetal Heart Rate (bpm): 163 bpm on bedside ultrasound.  Patient informed that the ultrasound is considered a limited obstetric ultrasound and is not intended to be a complete  ultrasound exam.  Patient also informed that the ultrasound is not being completed with the intent of assessing for fetal or placental anomalies or any pelvic abnormalities.  Explained that the purpose of today's ultrasound is to assess for fetal heart rate.  Patient acknowledges the purpose of the exam and the limitations of the study.   General: well-developed, well-nourished female in no acute distress  Skin: normal coloration and turgor, no rashes  Neurologic: oriented, normal, negative, normal mood  Extremities: normal strength, tone, and muscle mass, ROM of all joints is normal  HEENT PERRLA, extraocular movement intact and sclera clear, anicteric  Mouth/Teeth mucous membranes moist, pharynx normal without lesions and dental hygiene good  Neck supple and no masses  Cardiovascular: regular rate and rhythm  Respiratory:  no respiratory distress, normal breath sounds  Abdomen: soft, non-tender; bowel sounds normal; no masses,  no organomegaly     Assessment:    Pregnancy: G2P0100 at [redacted]w[redacted]d Patient Active Problem List   Diagnosis Date Noted  . 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 Supervision of high risk pregnancy, antepartum 06/13/2019  . History of IUFD 06/13/2019     Plan:    1. Nausea and vomiting during pregnancy  Antiemetics prescribed.  - promethazine (PHENERGAN) 25 MG tablet; Take 1 tablet (25 mg total) by mouth every 6 (six) hours as needed for nausea or vomiting.  Dispense: 30 tablet; Refill: 2 - ondansetron (ZOFRAN ODT) 4 MG disintegrating tablet; Take 1 tablet (4 mg total) by mouth every 6 (six) hours as needed for nausea.  Dispense: 30 tablet; Refill: 3  2. History of fetal cystic hygroma and hydrops fetalis in prior pregnancy, currently pregnant Counseled about getting NT scan/early anatomic scan. She agrees to do it.  - [place] MFM Fetal Nuchal Translucency; Future  3. Supervision of high risk pregnancy,  antepartum - Culture, OB Urine - Obstetric Panel, Including HIV - GC/Chlamydia probe amp ([place])not at [facility] - Hepatitis C antibody - Comprehensive metabolic panel Initial labs drawn. Continue prenatal vitamins. Genetic Screening discussed, First trimester screen, Quad screen and NIPS: considering. Ultrasound discussed; fetal anatomic survey: to be ordered later. Problem list reviewed and updated. The nature of [facility] with multiple MDs and other Advanced Practice Providers was explained to patient; also emphasized that residents, students are part of our team. Routine obstetric precautions reviewed. Return in about 4 weeks (around 07/26/2019) for OFFICE OB Visit (h/o 21 wk IUFD, wants FHR check for reassurance).     Verita Schneiders, MD, [place] for [employer], [place]

## 2019-06-28 NOTE — Patient Instructions (Signed)
First Trimester of Pregnancy The first trimester of pregnancy is from week 1 until the end of week 13 (months 1 through 3). A week after a sperm fertilizes an egg, the egg will implant on the wall of the uterus. This embryo will begin to develop into a baby. Genes from you and your partner will form the baby. The female genes will determine whether the baby will be a boy or a girl. At 6-8 weeks, the eyes and face will be formed, and the heartbeat can be seen on ultrasound. At the end of 12 weeks, all the baby's organs will be formed. Now that you are pregnant, you will want to do everything you can to have a healthy baby. Two of the most important things are to get good prenatal care and to follow your health care provider's instructions. Prenatal care is all the medical care you receive before the baby's birth. This care will help prevent, find, and treat any problems during the pregnancy and childbirth. Body changes during your first trimester Your body goes through many changes during pregnancy. The changes vary from woman to woman.  You may gain or lose a couple of pounds at first.  You may feel sick to your stomach (nauseous) and you may throw up (vomit). If the vomiting is uncontrollable, call your health care provider.  You may tire easily.  You may develop headaches that can be relieved by medicines. All medicines should be approved by your health care provider.  You may urinate more often. Painful urination may mean you have a bladder infection.  You may develop heartburn as a result of your pregnancy.  You may develop constipation because certain hormones are causing the muscles that push stool through your intestines to slow down.  You may develop hemorrhoids or swollen veins (varicose veins).  Your breasts may begin to grow larger and become tender. Your nipples may stick out more, and the tissue that surrounds them (areola) may become darker.  Your gums may bleed and may be  sensitive to brushing and flossing.  Dark spots or blotches (chloasma, mask of pregnancy) may develop on your face. This will likely fade after the baby is born.  Your menstrual periods will stop.  You may have a loss of appetite.  You may develop cravings for certain kinds of food.  You may have changes in your emotions from day to day, such as being excited to be pregnant or being concerned that something may go wrong with the pregnancy and baby.  You may have more vivid and strange dreams.  You may have changes in your hair. These can include thickening of your hair, rapid growth, and changes in texture. Some women also have hair loss during or after pregnancy, or hair that feels dry or thin. Your hair will most likely return to normal after your baby is born. What to expect at prenatal visits During a routine prenatal visit:  You will be weighed to make sure you and the baby are growing normally.  Your blood pressure will be taken.  Your abdomen will be measured to track your baby's growth.  The fetal heartbeat will be listened to between weeks 10 and 14 of your pregnancy.  Test results from any previous visits will be discussed. Your health care provider may ask you:  How you are feeling.  If you are feeling the baby move.  If you have had any abnormal symptoms, such as leaking fluid, bleeding, severe headaches, or abdominal   cramping.  If you are using any tobacco products, including cigarettes, chewing tobacco, and electronic cigarettes.  If you have any questions. Other tests that may be performed during your first trimester include:  Blood tests to find your blood type and to check for the presence of any previous infections. The tests will also be used to check for low iron levels (anemia) and protein on red blood cells (Rh antibodies). Depending on your risk factors, or if you previously had diabetes during pregnancy, you may have tests to check for high blood sugar  that affects pregnant women (gestational diabetes).  Urine tests to check for infections, diabetes, or protein in the urine.  An ultrasound to confirm the proper growth and development of the baby.  Fetal screens for spinal cord problems (spina bifida) and Down syndrome.  HIV (human immunodeficiency virus) testing. Routine prenatal testing includes screening for HIV, unless you choose not to have this test.  You may need other tests to make sure you and the baby are doing well. Follow these instructions at home: Medicines  Follow your health care provider's instructions regarding medicine use. Specific medicines may be either safe or unsafe to take during pregnancy.  Take a prenatal vitamin that contains at least 600 micrograms (mcg) of folic acid.  If you develop constipation, try taking a stool softener if your health care provider approves. Eating and drinking   Eat a balanced diet that includes fresh fruits and vegetables, whole grains, good sources of protein such as meat, eggs, or tofu, and low-fat dairy. Your health care provider will help you determine the amount of weight gain that is right for you.  Avoid raw meat and uncooked cheese. These carry germs that can cause birth defects in the baby.  Eating four or five small meals rather than three large meals a day may help relieve nausea and vomiting. If you start to feel nauseous, eating a few soda crackers can be helpful. Drinking liquids between meals, instead of during meals, also seems to help ease nausea and vomiting.  Limit foods that are high in fat and processed sugars, such as fried and sweet foods.  To prevent constipation: ? Eat foods that are high in fiber, such as fresh fruits and vegetables, whole grains, and beans. ? Drink enough fluid to keep your urine clear or pale yellow. Activity  Exercise only as directed by your health care provider. Most women can continue their usual exercise routine during  pregnancy. Try to exercise for 30 minutes at least 5 days a week. Exercising will help you: ? Control your weight. ? Stay in shape. ? Be prepared for labor and delivery.  Experiencing pain or cramping in the lower abdomen or lower back is a good sign that you should stop exercising. Check with your health care provider before continuing with normal exercises.  Try to avoid standing for long periods of time. Move your legs often if you must stand in one place for a long time.  Avoid heavy lifting.  Wear low-heeled shoes and practice good posture.  You may continue to have sex unless your health care provider tells you not to. Relieving pain and discomfort  Wear a good support bra to relieve breast tenderness.  Take warm sitz baths to soothe any pain or discomfort caused by hemorrhoids. Use hemorrhoid cream if your health care provider approves.  Rest with your legs elevated if you have leg cramps or low back pain.  If you develop varicose veins in   your legs, wear support hose. Elevate your feet for 15 minutes, 3-4 times a day. Limit salt in your diet. Prenatal care  Schedule your prenatal visits by the twelfth week of pregnancy. They are usually scheduled monthly at first, then more often in the last 2 months before delivery.  Write down your questions. Take them to your prenatal visits.  Keep all your prenatal visits as told by your health care provider. This is important. Safety  Wear your seat belt at all times when driving.  Make a list of emergency phone numbers, including numbers for family, friends, the hospital, and police and fire departments. General instructions  Ask your health care provider for a referral to a local prenatal education class. Begin classes no later than the beginning of month 6 of your pregnancy.  Ask for help if you have counseling or nutritional needs during pregnancy. Your health care provider can offer advice or refer you to specialists for help  with various needs.  Do not use hot tubs, steam rooms, or saunas.  Do not douche or use tampons or scented sanitary pads.  Do not cross your legs for long periods of time.  Avoid cat litter boxes and soil used by cats. These carry germs that can cause birth defects in the baby and possibly loss of the fetus by miscarriage or stillbirth.  Avoid all smoking, herbs, alcohol, and medicines not prescribed by your health care provider. Chemicals in these products affect the formation and growth of the baby.  Do not use any products that contain nicotine or tobacco, such as cigarettes and e-cigarettes. If you need help quitting, ask your health care provider. You may receive counseling support and other resources to help you quit.  Schedule a dentist appointment. At home, brush your teeth with a soft toothbrush and be gentle when you floss. Contact a health care provider if:  You have dizziness.  You have mild pelvic cramps, pelvic pressure, or nagging pain in the abdominal area.  You have persistent nausea, vomiting, or diarrhea.  You have a bad smelling vaginal discharge.  You have pain when you urinate.  You notice increased swelling in your face, hands, legs, or ankles.  You are exposed to fifth disease or chickenpox.  You are exposed to German measles (rubella) and have never had it. Get help right away if:  You have a fever.  You are leaking fluid from your vagina.  You have spotting or bleeding from your vagina.  You have severe abdominal cramping or pain.  You have rapid weight gain or loss.  You vomit blood or material that looks like coffee grounds.  You develop a severe headache.  You have shortness of breath.  You have any kind of trauma, such as from a fall or a car accident. Summary  The first trimester of pregnancy is from week 1 until the end of week 13 (months 1 through 3).  Your body goes through many changes during pregnancy. The changes vary from  woman to woman.  You will have routine prenatal visits. During those visits, your health care provider will examine you, discuss any test results you may have, and talk with you about how you are feeling. This information is not intended to replace advice given to you by your health care provider. Make sure you discuss any questions you have with your health care provider. Document Revised: 04/02/2017 Document Reviewed: 04/01/2016 Elsevier Patient Education  2020 [employer]vier Inc.  

## 2019-06-29 ENCOUNTER — Encounter: Payer: Self-pay | Admitting: *Deleted

## 2019-06-29 LAB — COMPREHENSIVE METABOLIC PANEL
ALT: 7 IU/L (ref 0–32)
AST: 16 IU/L (ref 0–40)
Albumin/Globulin Ratio: 1.1 — ABNORMAL LOW (ref 1.2–2.2)
Albumin: 3.9 g/dL (ref 3.9–5.0)
Alkaline Phosphatase: 48 IU/L (ref 39–117)
BUN/Creatinine Ratio: 13 (ref 9–23)
BUN: 9 mg/dL (ref 6–20)
Bilirubin Total: <0.2 mg/dL (ref 0.0–1.2)
CO2: 20 mmol/L (ref 20–29)
Calcium: 9.4 mg/dL (ref 8.7–10.2)
Chloride: 102 mmol/L (ref 96–106)
Creatinine, Ser: 0.68 mg/dL (ref 0.57–1.00)
GFR calc Af Amer: 141 mL/min/{1.73_m2} (ref >59)
GFR calc non Af Amer: 122 mL/min/{1.73_m2} (ref >59)
Globulin, Total: 3.4 g/dL (ref 1.5–4.5)
Glucose: 83 mg/dL (ref 65–99)
Potassium: 4.4 mmol/L (ref 3.5–5.2)
Sodium: 136 mmol/L (ref 134–144)
Total Protein: 7.3 g/dL (ref 6.0–8.5)

## 2019-06-29 LAB — OBSTETRIC PANEL, INCLUDING HIV
Antibody Screen: NEGATIVE
Basophils Absolute: 0 10*3/uL (ref 0.0–0.2)
Basos: 0 %
EOS (ABSOLUTE): 0 10*3/uL (ref 0.0–0.4)
Eos: 0 %
HIV Screen 4th Generation wRfx: NONREACTIVE
Hematocrit: 36 % (ref 34.0–46.6)
Hemoglobin: 11.7 g/dL (ref 11.1–15.9)
Hepatitis B Surface Ag: NEGATIVE
Immature Grans (Abs): 0 10*3/uL (ref 0.0–0.1)
Immature Granulocytes: 0 %
Lymphocytes Absolute: 1.4 10*3/uL (ref 0.7–3.1)
Lymphs: 20 %
MCH: 28.7 pg (ref 26.6–33.0)
MCHC: 32.5 g/dL (ref 31.5–35.7)
MCV: 89 fL (ref 79–97)
Monocytes Absolute: 0.6 10*3/uL (ref 0.1–0.9)
Monocytes: 9 %
Neutrophils Absolute: 4.7 10*3/uL (ref 1.4–7.0)
Neutrophils: 71 %
Platelets: 328 10*3/uL (ref 150–450)
RBC: 4.07 x10E6/uL (ref 3.77–5.28)
RDW: 13.3 % (ref 11.7–15.4)
RPR Ser Ql: NONREACTIVE
Rh Factor: POSITIVE
Rubella Antibodies, IGG: 3.59 index (ref >0.99)
WBC: 6.7 10*3/uL (ref 3.4–10.8)

## 2019-06-29 LAB — HEPATITIS C ANTIBODY: Hep C Virus Ab: <0.1 s/co ratio (ref 0.0–0.9)

## 2019-06-29 LAB — GC/CHLAMYDIA PROBE AMP (~~LOC~~) NOT AT ARMC
Chlamydia: NEGATIVE
Comment: NEGATIVE
Comment: NORMAL
Neisseria Gonorrhea: NEGATIVE

## 2019-06-29 NOTE — Addendum Note (Signed)
Addended by: Annabell Howells on: 06/29/2019 08:22 AM   Modules accepted: Orders

## 2019-07-01 LAB — URINE CULTURE, OB REFLEX

## 2019-07-01 LAB — CULTURE, OB URINE

## 2019-07-04 ENCOUNTER — Encounter (HOSPITAL_COMMUNITY): Payer: Self-pay

## 2019-07-06 ENCOUNTER — Ambulatory Visit (HOSPITAL_COMMUNITY): Payer: Medicaid Other | Admitting: *Deleted

## 2019-07-06 ENCOUNTER — Encounter (HOSPITAL_COMMUNITY): Payer: Self-pay

## 2019-07-06 ENCOUNTER — Other Ambulatory Visit: Payer: Self-pay

## 2019-07-06 ENCOUNTER — Other Ambulatory Visit: Payer: Self-pay | Admitting: Obstetrics & Gynecology

## 2019-07-06 ENCOUNTER — Ambulatory Visit (HOSPITAL_COMMUNITY)
Admission: RE | Admit: 2019-07-06 | Discharge: 2019-07-06 | Disposition: A | Discharge status: Home / Self Care | Payer: Medicaid Other | Source: Ambulatory Visit | Attending: Obstetrics and Gynecology | Admitting: Obstetrics and Gynecology

## 2019-07-06 ENCOUNTER — Other Ambulatory Visit (HOSPITAL_COMMUNITY): Payer: Self-pay | Admitting: *Deleted

## 2019-07-06 ENCOUNTER — Ambulatory Visit (HOSPITAL_COMMUNITY): Payer: Medicaid Other

## 2019-07-06 DIAGNOSIS — Z8759 Personal history of other complications of pregnancy, childbirth and the puerperium: Secondary | ICD-10-CM

## 2019-07-06 DIAGNOSIS — Z1379 Encounter for other screening for genetic and chromosomal anomalies: Secondary | ICD-10-CM | POA: Insufficient documentation

## 2019-07-06 DIAGNOSIS — O0990 Supervision of high risk pregnancy, unspecified, unspecified trimester: Secondary | ICD-10-CM | POA: Insufficient documentation

## 2019-07-06 DIAGNOSIS — Z3A13 13 weeks gestation of pregnancy: Secondary | ICD-10-CM | POA: Diagnosis not present

## 2019-07-06 DIAGNOSIS — O09299 Supervision of pregnancy with other poor reproductive or obstetric history, unspecified trimester: Secondary | ICD-10-CM

## 2019-07-06 DIAGNOSIS — O09291 Supervision of pregnancy with other poor reproductive or obstetric history, first trimester: Secondary | ICD-10-CM

## 2019-07-11 LAB — MATERNIT21 PLUS CORE+SCA
Fetal Fraction: 12
Monosomy X (Turner Syndrome): NOT DETECTED
Result (T21): NEGATIVE
Trisomy 13 (Patau syndrome): NEGATIVE
Trisomy 18 (Edwards syndrome): NEGATIVE
Trisomy 21 (Down syndrome): NEGATIVE
XXX (Triple X Syndrome): NOT DETECTED
XXY (Klinefelter Syndrome): NOT DETECTED
XYY (Jacobs Syndrome): NOT DETECTED

## 2019-07-12 ENCOUNTER — Telehealth (HOSPITAL_COMMUNITY): Payer: Self-pay | Admitting: Genetic Counselor

## 2019-07-12 NOTE — Telephone Encounter (Signed)
LVM for Ms. Serres re: good news about screening results. Requested a call back to my direct line to discuss these in more detail, as no identifiers were provided in voicemail message.   Buelah Manis, MS Genetic Counselor

## 2019-07-12 NOTE — Telephone Encounter (Signed)
Received call back from Ms. Naugher to discuss her negative noninvasive prenatal screening (NIPS) results. Specifically, Ms. Fasolino had MaterniT21 testing through [facility]. These negative results demonstrated an expected representation of chromosome 36, 74, 61, and sex chromosome material, greatly reducing the likelihood of trisomies 55, 15, or 33 and sex chromosome aneuploidies for the pregnancy. Ms. Schwaiger requested NOT to know about the expected fetal sex.  NIPS analyzes placental (fetal) DNA in maternal circulation. NIPS is considered to be highly specific and sensitive, but is not considered to be a diagnostic test. We reviewed that this testing identifies 91-99% of pregnancies with trisomies 77, 82, and 26, as well as sex chromosome abnormalities, but does not test for all genetic conditions. Diagnostic testing via chorionic villus sampling or amniocentesis is available should she be interested in confirming this result. She confirmed that she had no questions about these results at this time.  Buelah Manis, MS Genetic Counselor

## 2019-07-28 ENCOUNTER — Ambulatory Visit (INDEPENDENT_AMBULATORY_CARE_PROVIDER_SITE_OTHER): Payer: Medicaid Other | Admitting: Obstetrics & Gynecology

## 2019-07-28 ENCOUNTER — Encounter: Payer: Self-pay | Admitting: Obstetrics & Gynecology

## 2019-07-28 ENCOUNTER — Other Ambulatory Visit: Payer: Self-pay

## 2019-07-28 VITALS — BP 107/69 | HR 93 | Wt 106.3 lb

## 2019-07-28 DIAGNOSIS — O0990 Supervision of high risk pregnancy, unspecified, unspecified trimester: Secondary | ICD-10-CM | POA: Diagnosis not present

## 2019-07-28 DIAGNOSIS — O09299 Supervision of pregnancy with other poor reproductive or obstetric history, unspecified trimester: Secondary | ICD-10-CM

## 2019-07-28 DIAGNOSIS — O09292 Supervision of pregnancy with other poor reproductive or obstetric history, second trimester: Secondary | ICD-10-CM

## 2019-07-28 DIAGNOSIS — Z3A16 16 weeks gestation of pregnancy: Secondary | ICD-10-CM

## 2019-07-28 NOTE — Progress Notes (Signed)
   PRENATAL VISIT NOTE  Subjective:  Felicia Clark is a 24 y.o. G2P0100 at [redacted]w[redacted]d being seen today for ongoing prenatal care.  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  Patient reports no complaints.  Contractions: Not present. Vag. Bleeding: None.  Movement: Absent. Denies leaking of fluid.   The following portions of the patient's history were reviewed and updated as appropriate: allergies, current medications, past family history, past medical history, past social history, past surgical history and problem list.   Objective:   Vitals:   07/28/19 0838  BP: 107/69  Pulse: 93  Weight: 106 lb 4.8 oz (48.2 kg)    Fetal Status: Fetal Heart Rate (bpm): 152   Movement: Absent     General:  Alert, oriented and cooperative. Patient is in no acute distress.  Skin: Skin is warm and dry. No rash noted.   Cardiovascular: Normal heart rate noted  Respiratory: Normal respiratory effort, no problems with respiration noted  Abdomen: Soft, gravid, appropriate for gestational age.  Pain/Pressure: Absent     Pelvic: Cervical exam deferred        Extremities: Normal range of motion.  Edema: None  Mental Status: Normal mood and affect. Normal behavior. Normal judgment and thought content.   Assessment and Plan:  Pregnancy: G2P0100 at [redacted]w[redacted]d 1. 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 Low risk NIPS results.  AFP today. - AFP, Serum, Open Spina Bifida  2. Supervision of high risk pregnancy, antepartum - AFP, Serum, Open Spina Bifida No other complaints or concerns.  Routine obstetric precautions reviewed. Please refer to After Visit Summary for other counseling recommendations.   Return in  about 4 weeks (around 08/25/2019) for Virtual OB Visit.  Future Appointments  Date Time Provider [place]  08/16/2019  1:35 PM Cherre Blanc, MD [place]  08/17/2019  2:30 PM [place] NURSE [place] [facility]  08/17/2019  2:30 PM [place] 1 WH-[facility]FCUS [facility]    Verita Schneiders, MD

## 2019-07-28 NOTE — Patient Instructions (Signed)

## 2019-07-30 LAB — AFP, SERUM, OPEN SPINA BIFIDA
AFP MoM: 1.01
AFP Value: 49.2 ng/mL
Gest. Age on Collection Date: 16.1 weeks
Maternal Age At EDD: 25.7 yr
OSBR Risk 1 IN: 10000
Test Results:: NEGATIVE
Weight: 106 [lb_av]

## 2019-08-16 ENCOUNTER — Encounter: Payer: Medicaid Other | Admitting: Obstetrics & Gynecology

## 2019-08-17 ENCOUNTER — Ambulatory Visit (HOSPITAL_COMMUNITY): Payer: Medicaid Other | Admitting: *Deleted

## 2019-08-17 ENCOUNTER — Other Ambulatory Visit: Payer: Self-pay

## 2019-08-17 ENCOUNTER — Encounter (HOSPITAL_COMMUNITY): Payer: Self-pay

## 2019-08-17 ENCOUNTER — Ambulatory Visit (HOSPITAL_COMMUNITY)
Admission: RE | Admit: 2019-08-17 | Discharge: 2019-08-17 | Disposition: A | Discharge status: Home / Self Care | Payer: Medicaid Other | Source: Ambulatory Visit | Attending: Obstetrics and Gynecology | Admitting: Obstetrics and Gynecology

## 2019-08-17 DIAGNOSIS — Z3A19 19 weeks gestation of pregnancy: Secondary | ICD-10-CM | POA: Diagnosis not present

## 2019-08-17 DIAGNOSIS — O09292 Supervision of pregnancy with other poor reproductive or obstetric history, second trimester: Secondary | ICD-10-CM | POA: Diagnosis not present

## 2019-08-17 DIAGNOSIS — O0990 Supervision of high risk pregnancy, unspecified, unspecified trimester: Secondary | ICD-10-CM | POA: Diagnosis not present

## 2019-08-17 DIAGNOSIS — O0992 Supervision of high risk pregnancy, unspecified, second trimester: Secondary | ICD-10-CM

## 2019-08-17 DIAGNOSIS — Z8759 Personal history of other complications of pregnancy, childbirth and the puerperium: Secondary | ICD-10-CM | POA: Diagnosis not present

## 2019-08-18 ENCOUNTER — Other Ambulatory Visit (HOSPITAL_COMMUNITY): Payer: Self-pay | Admitting: *Deleted

## 2019-08-18 DIAGNOSIS — Z362 Encounter for other antenatal screening follow-up: Secondary | ICD-10-CM

## 2019-08-25 ENCOUNTER — Encounter: Payer: Self-pay | Admitting: Obstetrics and Gynecology

## 2019-08-25 ENCOUNTER — Telehealth (INDEPENDENT_AMBULATORY_CARE_PROVIDER_SITE_OTHER): Payer: Medicaid Other | Admitting: Obstetrics and Gynecology

## 2019-08-25 VITALS — BP 102/67 | HR 96

## 2019-08-25 DIAGNOSIS — Z3A20 20 weeks gestation of pregnancy: Secondary | ICD-10-CM

## 2019-08-25 DIAGNOSIS — Z8759 Personal history of other complications of pregnancy, childbirth and the puerperium: Secondary | ICD-10-CM

## 2019-08-25 DIAGNOSIS — O0992 Supervision of high risk pregnancy, unspecified, second trimester: Secondary | ICD-10-CM

## 2019-08-25 DIAGNOSIS — O09292 Supervision of pregnancy with other poor reproductive or obstetric history, second trimester: Secondary | ICD-10-CM

## 2019-08-25 DIAGNOSIS — O09299 Supervision of pregnancy with other poor reproductive or obstetric history, unspecified trimester: Secondary | ICD-10-CM

## 2019-08-25 DIAGNOSIS — O0990 Supervision of high risk pregnancy, unspecified, unspecified trimester: Secondary | ICD-10-CM

## 2019-08-25 NOTE — Progress Notes (Signed)
OBSTETRICS PRENATAL VIRTUAL VISIT ENCOUNTER NOTE  Provider location: [facility] [place]   I connected with Felicia Clark on 08/25/19 at  8:15 AM EDT by MyChart Video Encounter at home and verified that I am speaking with the correct person using two identifiers.   I discussed the limitations, risks, security and privacy concerns of performing an evaluation and management service virtually and the availability of in person appointments. I also discussed with the patient that there may be a patient responsible charge related to this service. The patient expressed understanding and agreed to proceed. Subjective:  Felicia Clark is a 24 y.o. G2P0100 at [redacted]w[redacted]d being seen today for ongoing prenatal care.  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  Patient reports no complaints.  Contractions: Not present. Vag. Bleeding: None.  Movement: Present. Denies any leaking of fluid.   The following portions of the patient's history were reviewed and updated as appropriate: allergies, current medications, past family history, past medical history, past social history, past surgical history and problem list.   Objective:   Vitals:   08/25/19 0819  BP: 102/67  Pulse: 96    Fetal Status:     Movement: Present     General:  Alert, oriented and cooperative. Patient is in no acute distress.  Respiratory: Normal respiratory effort, no problems with respiration noted  Mental Status: Normal mood and affect. Normal behavior. Normal judgment and thought content.  Rest of physical exam deferred due to type of encounter  Imaging: [place] MFM OB DETAIL +14 WK  Result Date: 08/17/2019 ----------------------------------------------------------------------  OBSTETRICS REPORT                        (Signed Final 08/17/2019 03:51 pm) ---------------------------------------------------------------------- Patient Info  ID #:       [id number]                          D.O.B.:  1995/06/18 (23 yrs)  Name:       Felicia Clark                   Visit Date: 08/17/2019 02:18 pm ---------------------------------------------------------------------- Performed By  Performed By:     Corky Crafts             Ref. Address:     [address]                    [place]                                                             [address]  Attending:        Tama High MD        [facility]  Referred By:      [facility] ---------------------------------------------------------------------- Orders   #  Description                          Code         Ordered By   1  [place] MFM OB DETAIL +14 [place]              [id number]     Verita Schneiders  ----------------------------------------------------------------------   #  Order #                    Accession #                 Episode #   1  [id number]  ---------------------------------------------------------------------- Indications   Encounter for antenatal screening for          Z36.3   malformations   [redacted] weeks gestation of pregnancy                Z3A.19   Poor obstetric history: Previous IUFD (46      O09.299   weeks)   Previous child with congenital anomaly,        O35.2XX0   antepartum (cystic hygroma, hydrops)  ---------------------------------------------------------------------- Fetal Evaluation  Num Of Fetuses:         1  Fetal Heart Rate(bpm):  154  Cardiac Activity:       Observed  Presentation:           Cephalic  Placenta:               Anterior  P. Cord Insertion:      Visualized  Amniotic Fluid  AFI FV:      Within normal limits                              Largest Pocket(cm)                              4.43  ---------------------------------------------------------------------- Biometry  BPD:      43.4  mm     G. Age:  19w 1d         56  %    CI:        73.14   %    70 - 86                                                          FL/HC:      18.5   %    16.1 - 18.3  HC:      161.3  mm     G. Age:  19w 0d         39  %    HC/AC:      1.21        1.09 - 1.39  AC:      133.6  mm     G. Age:  18w  6d         40  %    FL/BPD:     68.7   %  FL:       29.8  mm     G. Age:  19w 1d         51  %    FL/AC:      22.3   %    20 - 24  HUM:      30.2  mm     G. Age:  20w 0d         75  %  CER:        20  mm     G. Age:  19w 0d         50  %  NFT:       3.3  mm  LV:          5  mm  CM:        4.9  mm  Est. FW:     269  gm      0 lb 9 oz     46  % ---------------------------------------------------------------------- Gestational Age  LMP:           19w 0d        Date:  04/06/19                 EDD:   01/11/20  U/S Today:     19w 0d                                        EDD:   01/11/20  Best:          19w 0d     Det. By:  LMP  (04/06/19)          EDD:   01/11/20 ---------------------------------------------------------------------- Anatomy  Cranium:               Appears normal         LVOT:                   Not well visualized  Cavum:                 Appears normal         Aortic Arch:            Appears normal  Ventricles:            Appears normal         Ductal Arch:            Not well visualized  Choroid Plexus:        Appears normal         Diaphragm:              Not well visualized  Cerebellum:            Appears normal         Stomach:                Appears normal, left  sided  Posterior Fossa:       Appears normal         Abdomen:                Appears normal  Nuchal Fold:           Appears normal         Abdominal Wall:         Not well visualized  Face:                  Appears normal         Cord Vessels:           Appears normal (3                          (orbits and profile)                           vessel cord)  Lips:                  Appears normal         Kidneys:                Appear normal  Palate:                Not well visualized    Bladder:                Appears normal  Thoracic:              Appears normal         Spine:                  Ltd views no                                                                        intracranial signs of                                                                        NTD  Heart:                 Echogenic focus        Upper Extremities:      Appears normal                         in LV  RVOT:                  Not well visualized    Lower Extremities:      Appears normal  Other:  Heels and 5th digit visualized. Fetus appears to be female. Open          hands visualized. Nasal bone visualized. Technically difficult due to          fetal position and movement. ---------------------------------------------------------------------- Cervix Uterus Adnexa  Cervix  Length:            3.1  cm.  Normal appearance by transabdominal scan.  Uterus  No abnormality visualized.  Left Ovary  Size(cm)       3.1  x   1.5    x  2.7       Vol(ml): 6.57  Within normal limits.  Right Ovary  Size(cm)       3.3  x   1.8    x  2         Vol(ml): 6.22  Within normal limits. ---------------------------------------------------------------------- Impression  Patient returned for fetal anatomy scan. History of cystic  hygroma and fetal loss in previous pregnancy.  We performed fetal anatomy scan. An echogenic intracardiac  focus is seen. No other makers of aneuploidies or fetal  structural defects are seen. Fetal biometry is consistent with  her previously-established dates. Amniotic fluid is normal and  good fetal activity is seen.  I informed the patient that given that she had low rik for fetal  aneuploidies on cell-free fetal DNA screening, finding of  echogenic intracardiac focus should be considered a normal  variant and that the  risk of trisomy 21 is not increased. I also  reassured that echogenic focus does not increase the risk of  cardiac defects. I also informed her that only amniocentesis  will give a defintive result on the fetal karyotype.  Patient opted not to have amniocentesis. ---------------------------------------------------------------------- Recommendations  -An appointment was made for her to return in 4 weeks for  completion of fetal anatomy. ----------------------------------------------------------------------                  Tama High, MD Electronically Signed Final Report   08/17/2019 03:51 pm ----------------------------------------------------------------------   Assessment and Plan:  Pregnancy: G2P0100 at [redacted]w[redacted]d 1. Supervision of high risk pregnancy, antepartum Patient is doing well without complaints  2. 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 History of IUFD   Preterm labor symptoms and general obstetric precautions including but not limited to vaginal bleeding, contractions, leaking of fluid and fetal movement were reviewed in detail with the patient. I discussed the assessment and treatment plan with the patient. The patient was provided an opportunity to ask questions and all were answered. The patient agreed with the plan and demonstrated an understanding of the instructions. The patient was advised to call back or seek an in-person office evaluation/go to MAU at [facility] for any urgent or concerning symptoms. Please refer to After Visit Summary for other counseling recommendations.   I provided 12 minutes of face-to-face time during this encounter.  Return in about 4 weeks (around 09/22/2019) for Virtual, ROB, Low risk.  Future Appointments  Date Time Provider [place]  09/14/2019  3:15 PM [place] [facility]  09/14/2019  3:15 PM [place] 2 WH-[facility]FCUS [facility]    Mora Bellman, MD [facility] [place]

## 2019-09-14 ENCOUNTER — Ambulatory Visit: Payer: Medicaid Other | Admitting: *Deleted

## 2019-09-14 ENCOUNTER — Ambulatory Visit (HOSPITAL_COMMUNITY): Payer: Medicaid Other | Attending: Obstetrics and Gynecology

## 2019-09-14 ENCOUNTER — Other Ambulatory Visit: Payer: Self-pay

## 2019-09-14 DIAGNOSIS — O0990 Supervision of high risk pregnancy, unspecified, unspecified trimester: Secondary | ICD-10-CM | POA: Insufficient documentation

## 2019-09-14 DIAGNOSIS — Z8759 Personal history of other complications of pregnancy, childbirth and the puerperium: Secondary | ICD-10-CM | POA: Diagnosis not present

## 2019-09-14 DIAGNOSIS — Z3A23 23 weeks gestation of pregnancy: Secondary | ICD-10-CM

## 2019-09-14 DIAGNOSIS — O09292 Supervision of pregnancy with other poor reproductive or obstetric history, second trimester: Secondary | ICD-10-CM | POA: Diagnosis not present

## 2019-09-14 DIAGNOSIS — O358XX0 Maternal care for other (suspected) fetal abnormality and damage, not applicable or unspecified: Secondary | ICD-10-CM | POA: Diagnosis not present

## 2019-09-14 DIAGNOSIS — O352XX0 Maternal care for (suspected) hereditary disease in fetus, not applicable or unspecified: Secondary | ICD-10-CM | POA: Diagnosis not present

## 2019-09-14 DIAGNOSIS — Z362 Encounter for other antenatal screening follow-up: Secondary | ICD-10-CM

## 2019-09-15 ENCOUNTER — Other Ambulatory Visit: Payer: Self-pay | Admitting: *Deleted

## 2019-09-15 DIAGNOSIS — Z362 Encounter for other antenatal screening follow-up: Secondary | ICD-10-CM

## 2019-09-25 ENCOUNTER — Encounter: Payer: Self-pay | Admitting: Family Medicine

## 2019-09-25 ENCOUNTER — Telehealth (INDEPENDENT_AMBULATORY_CARE_PROVIDER_SITE_OTHER): Payer: Medicaid Other | Admitting: Family Medicine

## 2019-09-25 ENCOUNTER — Other Ambulatory Visit: Payer: Self-pay

## 2019-09-25 VITALS — BP 106/69 | HR 97

## 2019-09-25 DIAGNOSIS — Z3A24 24 weeks gestation of pregnancy: Secondary | ICD-10-CM

## 2019-09-25 DIAGNOSIS — O09292 Supervision of pregnancy with other poor reproductive or obstetric history, second trimester: Secondary | ICD-10-CM

## 2019-09-25 DIAGNOSIS — O0990 Supervision of high risk pregnancy, unspecified, unspecified trimester: Secondary | ICD-10-CM

## 2019-09-25 DIAGNOSIS — Z8759 Personal history of other complications of pregnancy, childbirth and the puerperium: Secondary | ICD-10-CM

## 2019-09-25 DIAGNOSIS — O09299 Supervision of pregnancy with other poor reproductive or obstetric history, unspecified trimester: Secondary | ICD-10-CM

## 2019-09-25 NOTE — Progress Notes (Signed)
I connected with  Felicia Clark on 09/25/19 at  9:15 AM EDT by telephone and verified that I am speaking with the correct person using two identifiers.   I discussed the limitations, risks, security and privacy concerns of performing an evaluation and management service by telephone and the availability of in person appointments. I also discussed with the patient that there may be a patient responsible charge related to this service. The patient expressed understanding and agreed to proceed.  Verdell Carmine, RN 09/25/2019  9:04 AM

## 2019-09-25 NOTE — Progress Notes (Signed)
OBSTETRICS PRENATAL VIRTUAL VISIT ENCOUNTER NOTE  Provider location: [facility] [place] [facility]   I connected with Felicia Clark on 09/25/19 at  9:15 AM EDT by MyChart Video Encounter at home and verified that I am speaking with the correct person using two identifiers.   I discussed the limitations, risks, security and privacy concerns of performing an evaluation and management service virtually and the availability of in person appointments. I also discussed with the patient that there may be a patient responsible charge related to this service. The patient expressed understanding and agreed to proceed. Subjective:  Felicia Clark is a 24 y.o. G2P0100 at [redacted]w[redacted]d being seen today for ongoing prenatal care.  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  Patient reports intermittent lower rib pain.  Contractions: Not present. Vag. Bleeding: None.  Movement: Present. Denies any leaking of fluid.   The following portions of the patient's history were reviewed and updated as appropriate: allergies, current medications, past family history, past medical history, past social history, past surgical history and problem list.   Objective:   Vitals:   09/25/19 0907  BP: 106/69  Pulse: 97    Fetal Status:     Movement: Present     General:  Alert, oriented and cooperative. Patient is in no acute distress.  Respiratory: Normal respiratory effort, no problems with respiration noted  Mental Status: Normal mood and affect. Normal behavior. Normal judgment and thought content.  Rest of physical exam deferred due to type of encounter  Imaging: [place] MFM OB FOLLOW UP  Result Date:  09/14/2019 ----------------------------------------------------------------------  OBSTETRICS REPORT                       (Signed Final 09/14/2019 04:11 pm) ---------------------------------------------------------------------- Patient Info  ID #:       [id number]                          D.O.B.:  April 24, 1996 (23 yrs)  Name:       Felicia Clark                   Visit Date: 09/14/2019 03:45 pm ---------------------------------------------------------------------- Performed By  Attending:        Johnell Comings MD         Ref. Address:     [address]                                                             [place], [place]  Performed By:     Jeanene Erb BS,      [facility]  Referred By:      [facility] ---------------------------------------------------------------------- Orders  #  Description                           Code        Ordered By  1  [place] MFM OB FOLLOW UP                   [phone]    [place] ----------------------------------------------------------------------  #  Order #                     Accession #                Episode #  1  [id number]                   [id number]                 [id number] ---------------------------------------------------------------------- Indications  Poor obstetric history: Previous IUFD (21      O09.299  weeks)  [redacted] weeks gestation of pregnancy                Z3A.23  Previous child with congenital anomaly,        O35.2XX0  antepartum (cystic hygroma, hydrops)  Antenatal follow-up for nonvisualized fetal    Z36.2  anatomy  Low Risk NIPS( Negative AFP)  Echogenic intracardiac focus of the heart      O35.8XX0  (EIF) ---------------------------------------------------------------------- Fetal Evaluation  Num Of Fetuses:         1  Fetal Heart Rate(bpm):  153  Cardiac Activity:        Observed  Presentation:           Cephalic  Placenta:               Anterior  P. Cord Insertion:      Previously Visualized  Amniotic Fluid  AFI FV:      Within normal limits                              Largest Pocket(cm)                              6.3 ---------------------------------------------------------------------- Biometry  BPD:      57.2  mm     G. Age:  23w 4d         65  %    CI:         75.4   %    70 - 86                                                          FL/HC:      19.9   %    19.2 - 20.8  HC:  208.9  mm     G. Age:  23w 0d         34  %    HC/AC:      1.14        1.05 - 1.21  AC:      183.8  mm     G. Age:  23w 1d         48  %    FL/BPD:     72.6   %    71 - 87  FL:       41.5  mm     G. Age:  23w 3d         58  %    FL/AC:      22.6   %    20 - 24  Est. FW:     581  gm      1 lb 4 oz     57  % ---------------------------------------------------------------------- Gestational Age  LMP:           23w 0d        Date:  04/06/19                 EDD:   01/11/20  U/S Today:     23w 2d                                        EDD:   01/09/20  Best:          23w 0d     Det. By:  LMP  (04/06/19)          EDD:   01/11/20 ---------------------------------------------------------------------- Anatomy  Cranium:               Appears normal         LVOT:                   Appears normal  Cavum:                 Appears normal         Aortic Arch:            Appears normal  Ventricles:            Appears normal         Ductal Arch:            Appears normal  Choroid Plexus:        Previously seen        Diaphragm:              Appears normal  Cerebellum:            Previously seen        Stomach:                Appears normal, left                                                                        sided  Posterior Fossa:       Previously seen        Abdomen:  Appears normal  Nuchal Fold:           Previously seen        Abdominal Wall:         Appears nml (cord                                                                         insert, abd wall)  Face:                  Orbits and profile     Cord Vessels:           Appears normal (3                         previously seen                                vessel cord)  Lips:                  Appears normal         Kidneys:                Appear normal  Palate:                Not well visualized    Bladder:                Appears normal  Thoracic:              Appears normal         Spine:                  Appears normal  Heart:                 Echogenic focus        Upper Extremities:      Previously seen                         in LV  RVOT:                  Appears normal         Lower Extremities:      Previously seen  Other:  Fetus appears to be female. Heels and 5th digit previously seen.          Open hands previously visualized. Nasal bone previously visualized.          Technically difficult due to fetal position and movement. ---------------------------------------------------------------------- Cervix Uterus Adnexa  Cervix  Length:            3.4  cm.  Normal appearance by transabdominal scan. ---------------------------------------------------------------------- Comments  This patient was seen for a follow up exam as the views of  the fetal anatomy were unable to be fully visualized during  her last exam.  She denies any problems since her last exam.  The patient's prior pregnancy was complicated by a fetal  demise at around 19 weeks.  She was informed that the fetal growth and amniotic fluid  level appears appropriate for her gestational age.  The views of  the fetal anatomy were visualized today.  There  were no obvious anomalies noted.  The limitations of ultrasound in the detection of all anomalies  was discussed.  Due to her past obstetrical history, follow-up growth  ultrasound was scheduled in the third trimester at around 32  weeks. ----------------------------------------------------------------------                   Johnell Comings, MD Electronically Signed Final Report   09/14/2019 04:11 pm ----------------------------------------------------------------------   Assessment and Plan:  Pregnancy: G2P0100 at [redacted]w[redacted]d 1. Supervision of high risk pregnancy, antepartum Good fetal movement. [place] for growth in 6 weeks  2. History of IUFD @ 21 weeks  3. 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 I discussed the assessment and treatment plan with the patient. The patient was provided an opportunity to ask questions and all were answered. The patient agreed with the plan and demonstrated an understanding of the instructions. The patient was advised to call back or seek an in-person office evaluation/go to [facility] at [facility] for any urgent or concerning symptoms. Please refer to After Visit Summary for other counseling recommendations.   I provided 9 minutes of face-to-face time during this encounter.  Return in about 3 weeks (around 10/16/2019) for HR OB f/u, 2 hr GTT, In Office.  Future Appointments  Date Time Provider [place]  11/17/2019  3:00 PM [facility] NURSE [facility]  11/17/2019  3:00 PM [facility] US1 [facility] [place] [facility] [place]

## 2019-10-17 ENCOUNTER — Encounter: Payer: Self-pay | Admitting: Obstetrics and Gynecology

## 2019-10-17 ENCOUNTER — Other Ambulatory Visit: Payer: Self-pay | Admitting: General Practice

## 2019-10-17 ENCOUNTER — Ambulatory Visit (INDEPENDENT_AMBULATORY_CARE_PROVIDER_SITE_OTHER): Payer: Medicaid Other | Admitting: Obstetrics and Gynecology

## 2019-10-17 ENCOUNTER — Other Ambulatory Visit: Payer: Medicaid Other

## 2019-10-17 ENCOUNTER — Other Ambulatory Visit: Payer: Self-pay

## 2019-10-17 VITALS — BP 109/65 | HR 89 | Wt 120.6 lb

## 2019-10-17 DIAGNOSIS — Z3A27 27 weeks gestation of pregnancy: Secondary | ICD-10-CM

## 2019-10-17 DIAGNOSIS — O0990 Supervision of high risk pregnancy, unspecified, unspecified trimester: Secondary | ICD-10-CM | POA: Diagnosis not present

## 2019-10-17 DIAGNOSIS — O09299 Supervision of pregnancy with other poor reproductive or obstetric history, unspecified trimester: Secondary | ICD-10-CM

## 2019-10-17 DIAGNOSIS — O0992 Supervision of high risk pregnancy, unspecified, second trimester: Secondary | ICD-10-CM

## 2019-10-17 DIAGNOSIS — Z8759 Personal history of other complications of pregnancy, childbirth and the puerperium: Secondary | ICD-10-CM

## 2019-10-17 DIAGNOSIS — O09292 Supervision of pregnancy with other poor reproductive or obstetric history, second trimester: Secondary | ICD-10-CM | POA: Diagnosis not present

## 2019-10-17 DIAGNOSIS — Z23 Encounter for immunization: Secondary | ICD-10-CM | POA: Diagnosis not present

## 2019-10-17 NOTE — Progress Notes (Signed)
Subjective:  Felicia Clark is a 24 y.o. G2P0100 at [redacted]w[redacted]d being seen today for ongoing prenatal care.  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  Patient reports no complaints.  Contractions: Not present. Vag. Bleeding: None.  Movement: Present. Denies leaking of fluid.   The following portions of the patient's history were reviewed and updated as appropriate: allergies, current medications, past family history, past medical history, past social history, past surgical history and problem list. Problem list updated.  Objective:   Vitals:   10/17/19 0909  BP: 109/65  Pulse: 89  Weight: 120 lb 9.6 oz (54.7 kg)    Fetal Status: Fetal Heart Rate (bpm): 140   Movement: Present     General:  Alert, oriented and cooperative. Patient is in no acute distress.  Skin: Skin is warm and dry. No rash noted.   Cardiovascular: Normal heart rate noted  Respiratory: Normal respiratory effort, no problems with respiration noted  Abdomen: Soft, gravid, appropriate for gestational age. Pain/Pressure: Absent     Pelvic:  Cervical exam deferred        Extremities: Normal range of motion.  Edema: Trace  Mental Status: Normal mood and affect. Normal behavior. Normal judgment and thought content.   Urinalysis:      Assessment and Plan:  Pregnancy: G2P0100 at [redacted]w[redacted]d  1. Supervision of high risk pregnancy, antepartum Stable Glucola today - Tdap vaccine greater than or equal to 7yo IM  2. 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 History of IUFD See above  Preterm labor symptoms and general obstetric precautions including but not limited to vaginal bleeding, contractions, leaking of fluid  and fetal movement were reviewed in detail with the patient. Please refer to After Visit Summary for other counseling recommendations.  Return in about 2 weeks (around 10/31/2019) for OB visit, virtual.   Chancy Milroy, MD

## 2019-10-17 NOTE — Patient Instructions (Signed)
Third Trimester of Pregnancy The third trimester is from week 28 through week 40 (months 7 through 9). The third trimester is a time when the unborn baby (fetus) is growing rapidly. At the end of the ninth month, the fetus is about 20 inches in length and weighs 6-10 pounds. Body changes during your third trimester Your body will continue to go through many changes during pregnancy. The changes vary from woman to woman. During the third trimester:  Your weight will continue to increase. You can expect to gain 25-35 pounds (11-16 kg) by the end of the pregnancy.  You may begin to get stretch marks on your hips, abdomen, and breasts.  You may urinate more often because the fetus is moving lower into your pelvis and pressing on your bladder.  You may develop or continue to have heartburn. This is caused by increased hormones that slow down muscles in the digestive tract.  You may develop or continue to have constipation because increased hormones slow digestion and cause the muscles that push waste through your intestines to relax.  You may develop hemorrhoids. These are swollen veins (varicose veins) in the rectum that can itch or be painful.  You may develop swollen, bulging veins (varicose veins) in your legs.  You may have increased body aches in the pelvis, back, or thighs. This is due to weight gain and increased hormones that are relaxing your joints.  You may have changes in your hair. These can include thickening of your hair, rapid growth, and changes in texture. Some women also have hair loss during or after pregnancy, or hair that feels dry or thin. Your hair will most likely return to normal after your baby is born.  Your breasts will continue to grow and they will continue to become tender. A yellow fluid (colostrum) may leak from your breasts. This is the first milk you are producing for your baby.  Your belly button may stick out.  You may notice more swelling in your hands,  face, or ankles.  You may have increased tingling or numbness in your hands, arms, and legs. The skin on your belly may also feel numb.  You may feel short of breath because of your expanding uterus.  You may have more problems sleeping. This can be caused by the size of your belly, increased need to urinate, and an increase in your body's metabolism.  You may notice the fetus "dropping," or moving lower in your abdomen (lightening).  You may have increased vaginal discharge.  You may notice your joints feel loose and you may have pain around your pelvic bone. What to expect at prenatal visits You will have prenatal exams every 2 weeks until week 36. Then you will have weekly prenatal exams. During a routine prenatal visit:  You will be weighed to make sure you and the baby are growing normally.  Your blood pressure will be taken.  Your abdomen will be measured to track your baby's growth.  The fetal heartbeat will be listened to.  Any test results from the previous visit will be discussed.  You may have a cervical check near your due date to see if your cervix has softened or thinned (effaced).  You will be tested for Group B streptococcus. This happens between 35 and 37 weeks. Your health care provider may ask you:  What your birth plan is.  How you are feeling.  If you are feeling the baby move.  If you have had any abnormal   symptoms, such as leaking fluid, bleeding, severe headaches, or abdominal cramping.  If you are using any tobacco products, including cigarettes, chewing tobacco, and electronic cigarettes.  If you have any questions. Other tests or screenings that may be performed during your third trimester include:  Blood tests that check for low iron levels (anemia).  Fetal testing to check the health, activity level, and growth of the fetus. Testing is done if you have certain medical conditions or if there are problems during the pregnancy.  Nonstress test  (NST). This test checks the health of your baby to make sure there are no signs of problems, such as the baby not getting enough oxygen. During this test, a belt is placed around your belly. The baby is made to move, and its heart rate is monitored during movement. What is false labor? False labor is a condition in which you feel small, irregular tightenings of the muscles in the womb (contractions) that usually go away with rest, changing position, or drinking water. These are called Braxton Hicks contractions. Contractions may last for hours, days, or even weeks before true labor sets in. If contractions come at regular intervals, become more frequent, increase in intensity, or become painful, you should see your health care provider. What are the signs of labor?  Abdominal cramps.  Regular contractions that start at 10 minutes apart and become stronger and more frequent with time.  Contractions that start on the top of the uterus and spread down to the lower abdomen and back.  Increased pelvic pressure and dull back pain.  A watery or bloody mucus discharge that comes from the vagina.  Leaking of amniotic fluid. This is also known as your "water breaking." It could be a slow trickle or a gush. Let your health care provider know if it has a color or strange odor. If you have any of these signs, call your health care provider right away, even if it is before your due date. Follow these instructions at home: Medicines  Follow your health care provider's instructions regarding medicine use. Specific medicines may be either safe or unsafe to take during pregnancy.  Take a prenatal vitamin that contains at least 600 micrograms (mcg) of folic acid.  If you develop constipation, try taking a stool softener if your health care provider approves. Eating and drinking   Eat a balanced diet that includes fresh fruits and vegetables, whole grains, good sources of protein such as meat, eggs, or tofu,  and low-fat dairy. Your health care provider will help you determine the amount of weight gain that is right for you.  Avoid raw meat and uncooked cheese. These carry germs that can cause birth defects in the baby.  If you have low calcium intake from food, talk to your health care provider about whether you should take a daily calcium supplement.  Eat four or five small meals rather than three large meals a day.  Limit foods that are high in fat and processed sugars, such as fried and sweet foods.  To prevent constipation: ? Drink enough fluid to keep your urine clear or pale yellow. ? Eat foods that are high in fiber, such as fresh fruits and vegetables, whole grains, and beans. Activity  Exercise only as directed by your health care provider. Most women can continue their usual exercise routine during pregnancy. Try to exercise for 30 minutes at least 5 days a week. Stop exercising if you experience uterine contractions.  Avoid heavy lifting.  Do   not exercise in extreme heat or humidity, or at high altitudes.  Wear low-heel, comfortable shoes.  Practice good posture.  You may continue to have sex unless your health care provider tells you otherwise. Relieving pain and discomfort  Take frequent breaks and rest with your legs elevated if you have leg cramps or low back pain.  Take warm sitz baths to soothe any pain or discomfort caused by hemorrhoids. Use hemorrhoid cream if your health care provider approves.  Wear a good support bra to prevent discomfort from breast tenderness.  If you develop varicose veins: ? Wear support pantyhose or compression stockings as told by your healthcare provider. ? Elevate your feet for 15 minutes, 3-4 times a day. Prenatal care  Write down your questions. Take them to your prenatal visits.  Keep all your prenatal visits as told by your health care provider. This is important. Safety  Wear your seat belt at all times when driving.  Make  a list of emergency phone numbers, including numbers for family, friends, the hospital, and police and fire departments. General instructions  Avoid cat litter boxes and soil used by cats. These carry germs that can cause birth defects in the baby. If you have a cat, ask someone to clean the litter box for you.  Do not travel far distances unless it is absolutely necessary and only with the approval of your health care provider.  Do not use hot tubs, steam rooms, or saunas.  Do not drink alcohol.  Do not use any products that contain nicotine or tobacco, such as cigarettes and e-cigarettes. If you need help quitting, ask your health care provider.  Do not use any medicinal herbs or unprescribed drugs. These chemicals affect the formation and growth of the baby.  Do not douche or use tampons or scented sanitary pads.  Do not cross your legs for long periods of time.  To prepare for the arrival of your baby: ? Take prenatal classes to understand, practice, and ask questions about labor and delivery. ? Make a trial run to the hospital. ? Visit the hospital and tour the maternity area. ? Arrange for maternity or paternity leave through employers. ? Arrange for family and friends to take care of pets while you are in the hospital. ? Purchase a rear-facing car seat and make sure you know how to install it in your car. ? Pack your hospital bag. ? Prepare the baby's nursery. Make sure to remove all pillows and stuffed animals from the baby's crib to prevent suffocation.  Visit your dentist if you have not gone during your pregnancy. Use a soft toothbrush to brush your teeth and be gentle when you floss. Contact a health care provider if:  You are unsure if you are in labor or if your water has broken.  You become dizzy.  You have mild pelvic cramps, pelvic pressure, or nagging pain in your abdominal area.  You have lower back pain.  You have persistent nausea, vomiting, or  diarrhea.  You have an unusual or bad smelling vaginal discharge.  You have pain when you urinate. Get help right away if:  Your water breaks before 37 weeks.  You have regular contractions less than 5 minutes apart before 37 weeks.  You have a fever.  You are leaking fluid from your vagina.  You have spotting or bleeding from your vagina.  You have severe abdominal pain or cramping.  You have rapid weight loss or weight gain.  You have   shortness of breath with chest pain.  You notice sudden or extreme swelling of your face, hands, ankles, feet, or legs.  Your baby makes fewer than 10 movements in 2 hours.  You have severe headaches that do not go away when you take medicine.  You have vision changes. Summary  The third trimester is from week 28 through week 40, months 7 through 9. The third trimester is a time when the unborn baby (fetus) is growing rapidly.  During the third trimester, your discomfort may increase as you and your baby continue to gain weight. You may have abdominal, leg, and back pain, sleeping problems, and an increased need to urinate.  During the third trimester your breasts will keep growing and they will continue to become tender. A yellow fluid (colostrum) may leak from your breasts. This is the first milk you are producing for your baby.  False labor is a condition in which you feel small, irregular tightenings of the muscles in the womb (contractions) that eventually go away. These are called Braxton Hicks contractions. Contractions may last for hours, days, or even weeks before true labor sets in.  Signs of labor can include: abdominal cramps; regular contractions that start at 10 minutes apart and become stronger and more frequent with time; watery or bloody mucus discharge that comes from the vagina; increased pelvic pressure and dull back pain; and leaking of amniotic fluid. This information is not intended to replace advice given to you by your  health care provider. Make sure you discuss any questions you have with your health care provider. Document Revised: 08/11/2018 Document Reviewed: 05/26/2016 Elsevier Patient Education  2020 [employer]  

## 2019-10-18 LAB — CBC
Hematocrit: 32.6 % — ABNORMAL LOW (ref 34.0–46.6)
Hemoglobin: 10.8 g/dL — ABNORMAL LOW (ref 11.1–15.9)
MCH: 30.6 pg (ref 26.6–33.0)
MCHC: 33.1 g/dL (ref 31.5–35.7)
MCV: 92 fL (ref 79–97)
Platelets: 321 10*3/uL (ref 150–450)
RBC: 3.53 x10E6/uL — ABNORMAL LOW (ref 3.77–5.28)
RDW: 13.6 % (ref 11.7–15.4)
WBC: 8 10*3/uL (ref 3.4–10.8)

## 2019-10-18 LAB — GLUCOSE TOLERANCE, 2 HOURS W/ 1HR
Glucose, 1 hour: 145 mg/dL (ref 65–179)
Glucose, 2 hour: 154 mg/dL — ABNORMAL HIGH (ref 65–152)
Glucose, Fasting: 69 mg/dL (ref 65–91)

## 2019-10-18 LAB — RPR: RPR Ser Ql: NONREACTIVE

## 2019-10-18 LAB — HIV ANTIBODY (ROUTINE TESTING W REFLEX): HIV Screen 4th Generation wRfx: NONREACTIVE

## 2019-10-19 ENCOUNTER — Encounter: Payer: Self-pay | Admitting: Obstetrics and Gynecology

## 2019-10-19 ENCOUNTER — Other Ambulatory Visit: Payer: Self-pay

## 2019-10-19 MED ORDER — ACCU-CHEK SOFTCLIX LANCETS MISC: 12 refills | Status: DC

## 2019-10-19 MED ORDER — ACCU-CHEK GUIDE VI STRP: ORAL_STRIP | 12 refills | Status: DC

## 2019-10-19 MED ORDER — ACCU-CHEK GUIDE W/DEVICE KIT: 1.0000 | PACK | Freq: Every day | 0 refills | Status: AC

## 2019-10-20 MED ORDER — ACCU-CHEK GUIDE VI STRP: ORAL_STRIP | 12 refills | Status: DC

## 2019-10-20 MED ORDER — ACCU-CHEK SOFTCLIX LANCETS MISC: 12 refills | Status: DC

## 2019-10-20 NOTE — Addendum Note (Signed)
Addended by: Langston Reusing on: 10/20/2019 01:28 PM   Modules accepted: Orders

## 2019-10-20 NOTE — Addendum Note (Signed)
Addended by: Langston Reusing on: 10/20/2019 01:26 PM   Modules accepted: Orders

## 2019-10-24 ENCOUNTER — Encounter: Payer: Medicaid Other | Attending: Obstetrics and Gynecology | Admitting: Registered"

## 2019-10-24 ENCOUNTER — Other Ambulatory Visit: Payer: Self-pay

## 2019-10-24 ENCOUNTER — Ambulatory Visit: Payer: Medicaid Other | Admitting: Registered"

## 2019-10-24 DIAGNOSIS — O24410 Gestational diabetes mellitus in pregnancy, diet controlled: Secondary | ICD-10-CM

## 2019-10-24 DIAGNOSIS — Z3A00 Weeks of gestation of pregnancy not specified: Secondary | ICD-10-CM | POA: Diagnosis not present

## 2019-10-24 DIAGNOSIS — O24419 Gestational diabetes mellitus in pregnancy, unspecified control: Secondary | ICD-10-CM | POA: Diagnosis not present

## 2019-10-24 DIAGNOSIS — Z713 Dietary counseling and surveillance: Secondary | ICD-10-CM | POA: Diagnosis not present

## 2019-10-24 NOTE — Progress Notes (Signed)
Patient was seen on 10/24/19 for Gestational Diabetes self-management. EDD 01/11/20. Patient states no history of GDM. Diet history obtained. Beverages include water, juice.  Patient is likely consuming excess carbohydrates in the form of juice, starches, sweets.   The following learning objectives were met by the patient :   States the definition of Gestational Diabetes  States why dietary management is important in controlling blood glucose  Describes the effects of carbohydrates on blood glucose levels  Demonstrates ability to create a balanced meal plan  Demonstrates carbohydrate counting   States when to check blood glucose levels  Demonstrates proper blood glucose monitoring techniques  States the effect of stress and exercise on blood glucose levels  States the importance of limiting caffeine and abstaining from alcohol and smoking  Plan:  Aim for 3 Carbohydrate Choices per meal (45 grams) +/- 1 either way  Aim for 1-2 Carbohydrate Choices per snack Begin reading food labels for Total Carbohydrate of foods If OK with your MD, consider  increasing your activity level by walking, Arm Chair Exercises or other activity daily as tolerated Begin checking Blood Glucose before breakfast and 2 hours after first bite of breakfast, lunch and dinner as directed by MD  Bring Log Book/Sheet and meter to every medical appointment  Baby Scripts: Patient was introduced to Baby Scripts and  plans to use as record of blood glucose electronically  Take medication if directed by MD  Patient already has a meter and is using but not testing at the recommended times yet.  Patient instructed to monitor glucose levels: FBS: 60 - 95 mg/dl 2 hour: <120 mg/dl  Patient received the following handouts:  Nutrition Diabetes and Pregnancy  Carbohydrate Counting List  Patient will be seen for follow-up as needed. 

## 2019-11-01 ENCOUNTER — Other Ambulatory Visit: Payer: Self-pay | Admitting: *Deleted

## 2019-11-01 ENCOUNTER — Other Ambulatory Visit: Payer: Self-pay

## 2019-11-01 DIAGNOSIS — O24419 Gestational diabetes mellitus in pregnancy, unspecified control: Secondary | ICD-10-CM

## 2019-11-07 ENCOUNTER — Other Ambulatory Visit: Payer: Self-pay

## 2019-11-07 ENCOUNTER — Encounter: Payer: Self-pay | Admitting: Advanced Practice Midwife

## 2019-11-07 ENCOUNTER — Ambulatory Visit (INDEPENDENT_AMBULATORY_CARE_PROVIDER_SITE_OTHER): Payer: Medicaid Other | Admitting: Advanced Practice Midwife

## 2019-11-07 VITALS — BP 102/71 | HR 104 | Wt 123.8 lb

## 2019-11-07 DIAGNOSIS — Z8759 Personal history of other complications of pregnancy, childbirth and the puerperium: Secondary | ICD-10-CM

## 2019-11-07 DIAGNOSIS — Z3A30 30 weeks gestation of pregnancy: Secondary | ICD-10-CM | POA: Diagnosis not present

## 2019-11-07 DIAGNOSIS — O0993 Supervision of high risk pregnancy, unspecified, third trimester: Secondary | ICD-10-CM

## 2019-11-07 DIAGNOSIS — O0990 Supervision of high risk pregnancy, unspecified, unspecified trimester: Secondary | ICD-10-CM

## 2019-11-07 LAB — POCT URINALYSIS DIP (DEVICE)
Bilirubin Urine: NEGATIVE
Glucose, UA: NEGATIVE mg/dL
Hgb urine dipstick: NEGATIVE
Ketones, ur: 15 mg/dL — AB
Nitrite: NEGATIVE
Protein, ur: NEGATIVE mg/dL
Specific Gravity, Urine: 1.02 (ref 1.005–1.030)
Urobilinogen, UA: 0.2 mg/dL (ref 0.0–1.0)
pH: 6.5 (ref 5.0–8.0)

## 2019-11-07 NOTE — Progress Notes (Signed)
   PRENATAL VISIT NOTE  Subjective:  Felicia Clark is a 24 y.o. G2P0100 at [redacted]w[redacted]d being seen today for ongoing prenatal care.  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  Patient reports no complaints.  Contractions: Not present. Vag. Bleeding: None.  Movement: Present. Denies leaking of fluid.   The following portions of the patient's history were reviewed and updated as appropriate: allergies, current medications, past family history, past medical history, past social history, past surgical history and problem list.   Objective:   Vitals:   11/07/19 1603  BP: 102/71  Pulse: (!) 104  Weight: 123 lb 12.8 oz (56.2 kg)    Fetal Status: Fetal Heart Rate (bpm): 142 Fundal Height: 31 cm Movement: Present     General:  Alert, oriented and cooperative. Patient is in no acute distress.  Skin: Skin is warm and dry. No rash noted.   Cardiovascular: Normal heart rate noted  Respiratory: Normal respiratory effort, no problems with respiration noted  Abdomen: Soft, gravid, appropriate for gestational age.  Pain/Pressure: Absent     Pelvic: Cervical exam deferred        Extremities: Normal range of motion.  Edema: Trace  Mental Status: Normal mood and affect. Normal behavior. Normal judgment and thought content.    Patient did not bring log with her.  Reports the morning levels are around 70, and once it was around 100. After meals she is usually around 75.   Assessment and Plan:  Pregnancy: G2P0100 at [redacted]w[redacted]d 1. Supervision of high risk pregnancy, antepartum - Routine care - Will send pic of glucose log via mychart, and advised to bring this with every appt.   Preterm labor symptoms and general obstetric precautions including but not limited to  vaginal bleeding, contractions, leaking of fluid and fetal movement were reviewed in detail with the patient. Please refer to After Visit Summary for other counseling recommendations.   Return in about 2 weeks (around 11/21/2019).  Future Appointments  Date Time Provider DISH  11/17/2019  7:45 AM [facility] NURSE [facility]  11/17/2019  7:45 AM [facility] US5 [facility] [place] DNP, CNM  11/07/19  4:40 PM

## 2019-11-07 NOTE — Patient Instructions (Addendum)
AREA PEDIATRIC/FAMILY PRACTICE PHYSICIANS  [place] ([id number]) . [facility] o Chambliss, MD; Eniola, MD; Hale, MD; Hensel, MD; McDiarmid, MD; McIntyer, MD; Neal, MD; Walden, MD o [address] [place], [place] [address] o [phone] o Mon-Fri 8:30-12:30, 1:30-5:00 o Providers come to see babies at [facility] o Accepting Medicaid . [facility] o Limited providers who accept newborns: Koirala, MD; Morrow, MD; Wolters, MD o [address], [place], [place] [address] o [phone] o Mon-Fri 8:00-5:30 o Babies seen by providers at [facility] o Does NOT accept Medicaid o Please call early in hospitalization for appointment (limited availability)  . [facility] o Mulberry, MD o [address] [place], [place] [address] o [phone] o Mon, Tue, Thur, Fri 8:30-5:00, Wed 10:00-7:00 (closed 1-2pm) o Babies seen by [facility] providers o Accepting Medicaid . [facility] - Pediatrician o Rubin, MD o [address], [place], [place] [address] o [phone] o Mon-Fri 8:30-5:00, Sat 8:30-12:00 o Provider comes to see babies at [facility] o Accepting Medicaid o Must have been referred from current patients or contacted office prior to delivery . [facility] ([facility]) o Brown, MD; Chandler, MD; Ettefagh, MD; Grant, MD; Lester, MD; McCormick, MD; McQueen, MD; Prose, MD; Simha, MD; Stanley, MD; Stryffeler, NP; Tebben, NP o [address], [place], [place] [address] o [phone] o Mon, Tue, Thur, Fri 8:30-5:30, Wed 9:30-5:30, Sat 8:30-12:30 o Babies seen by [facility] providers o Accepting Medicaid o Only accepting infants of first-time parents or siblings of current patients o Hospital discharge coordinator will make follow-up appointment . Jack Amos o [address],  [place], [place]  [address] o [phone]   Fax - [phone] . [facility] o [address], [place], [place]  [address] o Phone - [phone]   Fax - [phone] . Shilpa Gosrani o [address], [place], [place]  [address] o [phone]  [place] ([address]) . [facility] o Bates, MD; [place], MD; Cooper, Cox, MD; MD; Davis, MD; Dovico, MD; Ettefaugh, MD; Little, MD; Lowe, MD; Keiffer, MD; Melvin, MD; Sumner, MD; Williams, MD o [address], [place], [place] [address] o [phone] o Mon-Fri 8:30-5:00 (extended evenings Mon-Thur as needed), Sat-Sun 10:00-1:00 o Providers come to see babies at [facility] o Accepting Medicaid for families of first-time babies and families with all children in the household age 3 and under. Must register with office prior to making appointment (M-F only). . [facility] o Henson, NP; Knapp, MD; Lalonde, MD; Tysinger, PA o [address] [place], [place] [address] o [phone] o Mon-Fri 8:00-5:00 o Babies seen by providers at [facility] o Does NOT accept Medicaid/Commercial Insurance Only . [facility] ([facility])  o Artis, MD; Barnes, MD; Bratton, MD; Coccaro, MD; Lockett Gardner, MD; Kramer, MD; Marshall, MD; Netherton, MD; Poleto, MD; Skinner, MD o [address] [place], [place] [address] o [phone] o Mon-Fri 8:30-5:30, Sat (Oct.-Mar.) 9:00-1:00 o Babies seen by providers at [facility] o Accepting Medicaid  [place] ([address]) . [facility] o Reid, MD; Warner, MD o [address], [place],  [address] o [phone] o Mon-Fri 8:30-5:00, Sat 8:30-12:00 o Providers come to see babies at [facility] o Does NOT accept Medicaid . [facility] o Becker, PA; Hagler, MD; Scifres, PA; Sun, MD; Swayne, MD o [address],  [place], [place] [address] o [phone] o Mon-Fri 8:00-5:00 o Babies seen by providers at [facility] o Does NOT accept Medicaid o Only accepting babies of parents who are patients o Please call early in hospitalization for appointment (limited availability) . [facility] o Clark, MD; Frye, MD; Kelleher, MD; Mack, NP; Miller, MD; O'Keller, MD; Patterson, NP; Pudlo, MD; Puzio, MD; Thomas, MD; Tucker, MD; Twiselton, MD o [address], [place], [place] [address] o [phone] o Mon-Fri 8:00-5:00, Sat 9:00-12:00 o Providers come to see babies at [facility] o Does NOT accept Medicaid  [place] ([address]) . [facility] o Limited providers accepting new patients: Brake, NP; Wharton, PA o [address], [place], [place] [address] o [phone] o Mon-Fri 8:00-5:00 o Babies seen by providers at [facility] o Does NOT accept Medicaid o Only accepting babies of parents who are patients o Please call early in hospitalization for appointment (limited availability) . [facility] o Gay, MD; Quinlan, MD o [address] [place], [place] [address] o [phone] (press 1 to schedule appointment) o Mon-Fri 8:00-5:00 o Providers come to see babies at [facility] o Does NOT accept Medicaid . [facility] o Mazer, MD o [address] [place], [place] [address] o [phone] o Mon-Fri 8:30-5:00 (lunch 12:30-1:00), extended hours by appointment only Wed 5:00-6:30 o Babies seen by [facility] providers o Accepting Medicaid . [facility] o Banks, MD; Jordan, MD; Koberlein, MD o [address], [place], [place] [address] o [phone] o Mon-Fri 8:00-5:00 o Babies seen by [facility] providers o Does NOT accept Medicaid . [facility] o Parker, MD; Hunter, MD; Wallace, DO o [address] [place], [place]  [address] o [phone] o Mon-Fri 8:00-5:00 o Babies seen by [facility] providers o Does NOT accept Medicaid . [facility] o Brandon, PA; Brecken, PA; Christy, NP; Dees, MD; DeClaire, MD; DeWeese, MD; Hansen, NP; Mills, NP; Parrish, NP; Smoot, NP; Summer, MD; Vapne, MD o [address] [place], [place] [address] o [phone] o Mon-Fri 8:30-5:00, Sat 10:00-1:00 o Providers come to see babies at [facility] o Does NOT accept Medicaid o Free prenatal information session Tuesdays at 4:45pm . [facility] o Bouska, MD; Gordon, PA; Jeffery, PA; Weber, PA o [address] [place] [address] o [phone] o Mon-Fri 7:30-5:30 o Babies seen by [facility] providers . [facility] o [address], [place], [place]  [address] o [phone]   Fax - [phone]  [place] ([address] & [phone]) . [facility] o [place], [place] o [address] [place], [place] [address] o [phone] o Mon-Thur 8:00-6:00 o Providers come to see babies at [facility] o Accepting Medicaid . [facility] o Anderson, NP; Badger, MD; Beal, PA; Spencer, PA o [address] [place], [place] [address] o [phone] o Mon-Thur 7:30-7:30, Fri 7:30-4:30 o Babies seen by [facility] providers o Accepting Medicaid . [facility] o Agbuya, MD; Klett, NP; Romgoolam, MD o [address], [place], [place] [address] o [phone] o Mon-Fri 8:30-5:00, Sat 8:30-12:00 o Providers come to see babies at [facility] o Accepting Medicaid o Must have "Meet & Greet" appointment at office prior to delivery . [facility] ) o McCord,   MD; Juleen China, MD; Clydene Laming, [address], [place], [place] [address] o [phone] o Mon-Wed 8:00-6:00, Thur-Fri 8:00-5:00, Sat 9:00-12:00 o Providers come to  see babies at [facility] o Does NOT accept Medicaid o Only accepting siblings of current patients . [facility] [place], [place], [place], [place]  [address] o [phone]   Fax [phone] . [facility] [place] [address], [place], [place]  [address] o [phone]   Fax - Morton Gorman [phone] & [phone]) . Therapist, music at [place], DO; [place], [address] [place], [place] [address] o [phone] o Mon-Fri 7:00-5:00 o Babies seen by [facility] providers o Does NOT accept Medicaid . [place], MD; [place], [place]; [place], [place], [place], [place] [address] o [phone] o Mon-Fri 8:00-5:00 o Babies seen by [facility] providers o Accepting Medicaid . [place], MD; [place], [place]; [place], NP; [place], [place], [place], [place] [address] o [phone] o Mon-Fri 8:00-5:00 o Babies seen by providers at [place] [phone]) . [facility] [place], [place] o [address] [place], [place] [address] o [phone] o Mon-Fri 8:00-5:00 o Babies seen by [facility] providers o Does NOT accept Medicaid o Limited availability, please call early in hospitalization to schedule follow-up . [facility] Leilani Merl, PA; Maisie Fus, MD; [place], MD; [place], [place]; Jeannine Kitten, MD; [place], [place] [facility] [address], [place], [place] [address] o [phone] o Mon-Fri 8:30-5:00, Sat 9:00-12:00 o Babies seen by providers at [facility] o Accepting Medicaid o Please register online then schedule online or call office o [email or web address] . [place] ([place]  [place]) Kristian Covey, NP; Dwyane Dee, MD; Leonidas Romberg, PA o [address] [place], [place], [place] [address] o [phone] o Mon-Fri 8:00-5:00 o Babies seen by providers at [facility] o Accepting Medicaid . [place] ([facility]) Dairl Ponder, MD; Rayvon Char, NP; Melina Modena, MD o [address] [place], [place], [place] [address] o [phone] o Mon-Fri 8:00-5:30, Sat&Sun by appointment (phones open at 8:30) o Babies seen by [facility] providers o Accepting Medicaid o Must be a first-time baby or sibling of current patient . [place], [address], [place], [place]  [address] o [phone]   Fax - [phone]  [place] [phone] & [phone]) . [place], [place]; [place], [place]; Benjamine Mola, MD; [place], [place]; Harrell Lark, MD o [address] [place], [place] [address] o [phone] o Mon-Thur 8:00-7:00, Fri 8:00-5:00, Sat 8:00-12:00, Sun 9:00-12:00 o Babies seen by [facility] providers o Accepting Medicaid . [facility] at [facility], MD; Ruthann Cancer, MD; [facility], MD o [address], [place], [place] [address] o [phone] o Mon-Thur 8:00-5:00 o Babies seen by providers at [facility] o Accepting Medicaid . [facility] [place], MD; Coe[place], MD; Amedeo Plenty, MD; Bobby Rumpf, MD; List, MD; Lavonia Drafts, MD; Ruthann Cancer, MD; Selinda Eon, MD; Audie Box, MD; Jim Like, MD; Christie Nottingham, MD; Hubbard Hartshorn, MD; Modena Nunnery, MD o [address] [place], [place]  [address] o [phone] o Mon-Fri 8:00-5:30, Sat (Oct.-Mar.) 9:00-1:00 o Babies seen by providers at [facility] o Accepting Medicaid o Must fill out new patient packet, available online at [email or web address] . [place] ([facility] Barnabas Lister, NP; Kenton Kingfisher, NP; Claiborne Billings, NP; Rolla Plate, MD;  [place], [place]; Carola Rhine, MD; Tyron Russell, MD; Delia Chimes, NP o [address], [place], [place] [address] o [phone] o Mon-Thur 8:00-5:30, Fri 8:00-5:00 o Babies seen by providers at [place] [phone]) . [place], [place]; [place], MD; Dennard Schaumann, MD; [place], [place] o [address] [place], [place] [address] o [phone] o Mon-Fri 8:00-5:00 o Babies seen by providers at [place] [phone]) . [place] [facility] [place], [place]; Olen Pel, MD; [place], [place], [place], [place] [address] o [phone] o Mon-Fri 8:00-5:00 o Babies seen by providers at [facility] o Does NOT accept Medicaid o Limited appointment availability, please call early in hospitalization  . Therapist, music at [place], [place]; [place], [address] [place], [place] [address] o [phone] o Mon-Fri 8:00-5:00 o Babies seen by [facility] providers o Does NOT accept Medicaid . [facility] Su [place], MD; Guy Sandifer, MD; [place], [place]; [place], [place] [address], [place], [place] [address] o [phone] o Mon-Fri 8:00-5:00 o After hours clinic [facility] [place], [place] [address]) [phone] Mon-Fri 5:00-8:00, Sat 12:00-6:00, Sun 10:00-4:00 o Babies seen by [facility] providers o Accepting Medicaid . [place] [facility] o [address], [place], [place]  [address] o [phone]   Fax - [phone]  [place] [phone]) . Therapist, music at [facility], MD o [address] [place], [place], [place] [address] o [phone] o Mon-Fri 8:00-5:00 o Babies seen by [facility] providers o Does NOT accept Medicaid . [place] ([facility]) Bing Neighbors, MD o [address] [place], [place], [place]  [address] o [phone] o Mon-Thur 8:00-7:00, Fri 8:00-5:00, Sat 8:00-12:00 o Babies seen by providers at [facility] o Accepting Medicaid - but does not have vaccinations in office (must be received elsewhere) o Limited availability, please call early in hospitalization  [place] ([address]) . [place], [place] [address] [place] [address] o [phone]  Fax [phone]  Considering Doren Custard? Guide for patients at [facility] [employer] Why consider waterbirth? . Gentle birth for babies  . Less pain medicine used in labor  . May allow for passive descent/less pushing  . May reduce perineal tears  . More mobility and instinctive maternal position changes  . Increased maternal relaxation  . Reduced blood pressure in labor   Is waterbirth safe? What are the risks of infection, drowning or other complications? . Infection:  Marland Kitchen Very low risk (3.7 % for tub vs 4.8% for bed)  . 7 in 40 waterbirths with documented infection  . Poorly cleaned equipment most common cause  . Slightly lower group B strep transmission rate  . Drowning  . Maternal:  . Very low risk  . Related to seizures or fainting  . Newborn:  Marland Kitchen Very low risk. No evidence of increased risk of respiratory problems in multiple large studies  . Physiological protection from breathing under  water  . Avoid underwater birth if there are any fetal complications  . Once baby's head is out of the water, keep it out.  . Birth complication  . Some reports of cord trauma, but risk decreased by bringing baby to surface gradually  . No evidence of increased risk of shoulder dystocia. Mothers can usually change positions faster in water than in a bed, possibly aiding the maneuvers to free the shoulder.  ? You must attend a Waterbirth class at [facility] . 3rd Wednesday of every month from 7-9pm  . Free  . Register by calling [phone] or  online at [email or web address]  . Bring [place] the certificate from the class to your prenatal appointment  Meet with a midwife at 36 weeks to see if you can still plan a waterbirth and to sign the consent.   If you plan a waterbirth at [facility], you can opt to purchase the following: . Fish Net . Bathing suit top (optional)  . Long-handled mirror (optional)  .  Things that would prevent you from having a waterbirth: . Unknown or Positive COVID-19 diagnosis upon admission to hospital  . Premature, <37wks  . Previous cesarean birth  . Presence of thick meconium-stained fluid  . Multiple gestation (Twins, triplets, etc.)  . Uncontrolled diabetes or gestational diabetes requiring medication  . Hypertension requiring medication or diagnosis of pre-eclampsia  . Heavy vaginal bleeding  . Non-reassuring fetal heart rate  . Active infection (MRSA, etc.). Group B Strep is NOT a contraindication for waterbirth.  . If your labor has to be induced and induction method requires continuous monitoring of the baby's heart rate  . Other risks/issues identified by your obstetrical provider  Please remember that birth is unpredictable. Under certain unforeseeable circumstances your provider may advise against giving birth in the tub. These decisions will be made on a case-by-case basis and with the safety of you and your baby as our highest priority.  **Please remember that in order to have a waterbirth, you must test Negative to COVID-19 upon admission to the hospital.**

## 2019-11-14 ENCOUNTER — Encounter: Payer: Self-pay | Admitting: Advanced Practice Midwife

## 2019-11-17 ENCOUNTER — Ambulatory Visit: Payer: Medicaid Other | Admitting: *Deleted

## 2019-11-17 ENCOUNTER — Other Ambulatory Visit: Payer: Self-pay | Admitting: *Deleted

## 2019-11-17 ENCOUNTER — Ambulatory Visit: Payer: Medicaid Other | Attending: Obstetrics

## 2019-11-17 ENCOUNTER — Ambulatory Visit: Payer: Medicaid Other

## 2019-11-17 ENCOUNTER — Other Ambulatory Visit: Payer: Self-pay

## 2019-11-17 VITALS — BP 101/64 | HR 92

## 2019-11-17 DIAGNOSIS — O24410 Gestational diabetes mellitus in pregnancy, diet controlled: Secondary | ICD-10-CM | POA: Diagnosis not present

## 2019-11-17 DIAGNOSIS — Z362 Encounter for other antenatal screening follow-up: Secondary | ICD-10-CM | POA: Diagnosis not present

## 2019-11-17 DIAGNOSIS — O358XX0 Maternal care for other (suspected) fetal abnormality and damage, not applicable or unspecified: Secondary | ICD-10-CM

## 2019-11-17 DIAGNOSIS — O0990 Supervision of high risk pregnancy, unspecified, unspecified trimester: Secondary | ICD-10-CM | POA: Diagnosis present

## 2019-11-17 DIAGNOSIS — O09293 Supervision of pregnancy with other poor reproductive or obstetric history, third trimester: Secondary | ICD-10-CM

## 2019-11-17 DIAGNOSIS — Z3A32 32 weeks gestation of pregnancy: Secondary | ICD-10-CM | POA: Diagnosis not present

## 2019-11-17 DIAGNOSIS — Z8759 Personal history of other complications of pregnancy, childbirth and the puerperium: Secondary | ICD-10-CM | POA: Insufficient documentation

## 2019-11-17 DIAGNOSIS — O352XX0 Maternal care for (suspected) hereditary disease in fetus, not applicable or unspecified: Secondary | ICD-10-CM

## 2019-11-23 ENCOUNTER — Encounter: Payer: Self-pay | Admitting: Family Medicine

## 2019-11-23 ENCOUNTER — Other Ambulatory Visit: Payer: Self-pay

## 2019-11-23 ENCOUNTER — Ambulatory Visit (INDEPENDENT_AMBULATORY_CARE_PROVIDER_SITE_OTHER): Payer: Medicaid Other | Admitting: Family Medicine

## 2019-11-23 VITALS — BP 104/71 | HR 104 | Wt 123.1 lb

## 2019-11-23 DIAGNOSIS — Z8759 Personal history of other complications of pregnancy, childbirth and the puerperium: Secondary | ICD-10-CM

## 2019-11-23 DIAGNOSIS — O24410 Gestational diabetes mellitus in pregnancy, diet controlled: Secondary | ICD-10-CM | POA: Diagnosis not present

## 2019-11-23 DIAGNOSIS — O0993 Supervision of high risk pregnancy, unspecified, third trimester: Secondary | ICD-10-CM

## 2019-11-23 DIAGNOSIS — Z3A33 33 weeks gestation of pregnancy: Secondary | ICD-10-CM | POA: Diagnosis not present

## 2019-11-23 DIAGNOSIS — O0990 Supervision of high risk pregnancy, unspecified, unspecified trimester: Secondary | ICD-10-CM

## 2019-11-23 LAB — POCT URINALYSIS DIP (DEVICE)
Bilirubin Urine: NEGATIVE
Glucose, UA: NEGATIVE mg/dL
Ketones, ur: NEGATIVE mg/dL
Nitrite: NEGATIVE
Protein, ur: 30 mg/dL — AB
Specific Gravity, Urine: 1.025 (ref 1.005–1.030)
Urobilinogen, UA: 1 mg/dL (ref 0.0–1.0)
pH: 6 (ref 5.0–8.0)

## 2019-11-23 NOTE — Progress Notes (Signed)
   Subjective:  Felicia Clark is a 24 y.o. G2P0100 at [redacted]w[redacted]d being seen today for ongoing prenatal care.  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  Patient reports mild heartburn and hip cramps.  Contractions: Not present. Vag. Bleeding: None.  Movement: Present. Denies leaking of fluid.   The following portions of the patient's history were reviewed and updated as appropriate: allergies, current medications, past family history, past medical history, past social history, past surgical history and problem list. Problem list updated.  Objective:   Vitals:   11/23/19 1429  BP: 104/71  Pulse: 104  Weight: 123 lb 1.6 oz (55.8 kg)    Fetal Status: Fetal Heart Rate (bpm): 146   Movement: Present     General:  Alert, oriented and cooperative. Patient is in no acute distress.  Skin: Skin is warm and dry. No rash noted.   Cardiovascular: Normal heart rate noted  Respiratory: Normal respiratory effort, no problems with respiration noted  Abdomen: Soft, gravid, appropriate for gestational age. Pain/Pressure: Present     Pelvic: Vag. Bleeding: None     Cervical exam deferred        Extremities: Normal range of motion.  Edema: Trace  Mental Status: Normal mood and affect. Normal behavior. Normal judgment and thought content.   Urinalysis:      Assessment and Plan:  Pregnancy: G2P0100 at [redacted]w[redacted]d  1. Supervision of high risk pregnancy, antepartum Stable BP and FHR normal Mild GERD, encouraged use of TUMS which are effective for her Discussed sleeping position for hip pain, declined maternity belt at present  2. History of IUFD Loss at 22wks Getting regular growths w MFM, they have not recommended weekly testing  3. Diet controlled gestational  diabetes mellitus (GDM) in third trimester Sugars reviewed (see media tab 11/17/2019) 93% at goal  Preterm labor symptoms and general obstetric precautions including but not limited to vaginal bleeding, contractions, leaking of fluid and fetal movement were reviewed in detail with the patient. Please refer to After Visit Summary for other counseling recommendations.  Return in about 2 weeks (around 12/07/2019) for ob visit.   Clarnce Flock, MD

## 2019-11-23 NOTE — Patient Instructions (Signed)
Contraception Choices Contraception, also called birth control, refers to methods or devices that prevent pregnancy. Hormonal methods Contraceptive implant  A contraceptive implant is a thin, plastic tube that contains a hormone. It is inserted into the upper part of the arm. It can remain in place for up to 3 years. Progestin-only injections Progestin-only injections are injections of progestin, a synthetic form of the hormone progesterone. They are given every 3 months by a health care provider. Birth control pills  Birth control pills are pills that contain hormones that prevent pregnancy. They must be taken once a day, preferably at the same time each day. Birth control patch  The birth control patch contains hormones that prevent pregnancy. It is placed on the skin and must be changed once a week for three weeks and removed on the fourth week. A prescription is needed to use this method of contraception. Vaginal ring  A vaginal ring contains hormones that prevent pregnancy. It is placed in the vagina for three weeks and removed on the fourth week. After that, the process is repeated with a new ring. A prescription is needed to use this method of contraception. Emergency contraceptive Emergency contraceptives prevent pregnancy after unprotected sex. They come in pill form and can be taken up to 5 days after sex. They work best the sooner they are taken after having sex. Most emergency contraceptives are available without a prescription. This method should not be used as your only form of birth control. Barrier methods Female condom  A female condom is a thin sheath that is worn over the penis during sex. Condoms keep sperm from going inside a woman's body. They can be used with a spermicide to increase their effectiveness. They should be disposed after a single use. Female condom  A female condom is a soft, loose-fitting sheath that is put into the vagina before sex. The condom keeps  sperm from going inside a woman's body. They should be disposed after a single use. Diaphragm  A diaphragm is a soft, dome-shaped barrier. It is inserted into the vagina before sex, along with a spermicide. The diaphragm blocks sperm from entering the uterus, and the spermicide kills sperm. A diaphragm should be left in the vagina for 6-8 hours after sex and removed within 24 hours. A diaphragm is prescribed and fitted by a health care provider. A diaphragm should be replaced every 1-2 years, after giving birth, after gaining more than 15 lb (6.8 kg), and after pelvic surgery. Cervical cap  A cervical cap is a round, soft latex or plastic cup that fits over the cervix. It is inserted into the vagina before sex, along with spermicide. It blocks sperm from entering the uterus. The cap should be left in place for 6-8 hours after sex and removed within 48 hours. A cervical cap must be prescribed and fitted by a health care provider. It should be replaced every 2 years. Sponge  A sponge is a soft, circular piece of polyurethane foam with spermicide on it. The sponge helps block sperm from entering the uterus, and the spermicide kills sperm. To use it, you make it wet and then insert it into the vagina. It should be inserted before sex, left in for at least 6 hours after sex, and removed and thrown away within 30 hours. Spermicides Spermicides are chemicals that kill or block sperm from entering the cervix and uterus. They can come as a cream, jelly, suppository, foam, or tablet. A spermicide should be inserted into  the vagina with an applicator at least 10-15 minutes before sex to allow time for it to work. The process must be repeated every time you have sex. Spermicides do not require a prescription. Intrauterine contraception Intrauterine device (IUD) An IUD is a T-shaped device that is put in a woman's uterus. There are two types:  Hormone IUD.This type contains progestin, a synthetic form of the  hormone progesterone. This type can stay in place for 3-5 years.  Copper IUD.This type is wrapped in copper wire. It can stay in place for 10 years.  Permanent methods of contraception Female tubal ligation In this method, a woman's fallopian tubes are sealed, tied, or blocked during surgery to prevent eggs from traveling to the uterus. Hysteroscopic sterilization In this method, a small, flexible insert is placed into each fallopian tube. The inserts cause scar tissue to form in the fallopian tubes and block them, so sperm cannot reach an egg. The procedure takes about 3 months to be effective. Another form of birth control must be used during those 3 months. Female sterilization This is a procedure to tie off the tubes that carry sperm (vasectomy). After the procedure, the man can still ejaculate fluid (semen). Natural planning methods Natural family planning In this method, a couple does not have sex on days when the woman could become pregnant. Calendar method This means keeping track of the length of each menstrual cycle, identifying the days when pregnancy can happen, and not having sex on those days. Ovulation method In this method, a couple avoids sex during ovulation. Symptothermal method This method involves not having sex during ovulation. The woman typically checks for ovulation by watching changes in her temperature and in the consistency of cervical mucus. Post-ovulation method In this method, a couple waits to have sex until after ovulation. Summary  Contraception, also called birth control, means methods or devices that prevent pregnancy.  Hormonal methods of contraception include implants, injections, pills, patches, vaginal rings, and emergency contraceptives.  Barrier methods of contraception can include female condoms, female condoms, diaphragms, cervical caps, sponges, and spermicides.  There are two types of IUDs (intrauterine devices). An IUD can be put in a woman's  uterus to prevent pregnancy for 3-5 years.  Permanent sterilization can be done through a procedure for males, females, or both.  Natural family planning methods involve not having sex on days when the woman could become pregnant. This information is not intended to replace advice given to you by your health care provider. Make sure you discuss any questions you have with your health care provider. Document Revised: 04/22/2017 Document Reviewed: 05/23/2016 Elsevier Patient Education  2020 [employer]   Breastfeeding  Choosing to breastfeed is one of the best decisions you can make for yourself and your baby. A change in hormones during pregnancy causes your breasts to make breast milk in your milk-producing glands. Hormones prevent breast milk from being released before your baby is born. They also prompt milk flow after birth. Once breastfeeding has begun, thoughts of your baby, as well as his or her sucking or crying, can stimulate the release of milk from your milk-producing glands. Benefits of breastfeeding Research shows that breastfeeding offers many health benefits for infants and mothers. It also offers a cost-free and convenient way to feed your baby. For your baby  Your first milk (colostrum) helps your baby's digestive system to function better.  Special cells in your milk (antibodies) help your baby to fight off infections.  Breastfed babies are   less likely to develop asthma, allergies, obesity, or type 2 diabetes. They are also at lower risk for sudden infant death syndrome (SIDS).  Nutrients in breast milk are better able to meet your baby's needs compared to infant formula.  Breast milk improves your baby's brain development. For you  Breastfeeding helps to create a very special bond between you and your baby.  Breastfeeding is convenient. Breast milk costs nothing and is always available at the correct temperature.  Breastfeeding helps to burn calories. It helps you  to lose the weight that you gained during pregnancy.  Breastfeeding makes your uterus return faster to its size before pregnancy. It also slows bleeding (lochia) after you give birth.  Breastfeeding helps to lower your risk of developing type 2 diabetes, osteoporosis, rheumatoid arthritis, cardiovascular disease, and breast, ovarian, uterine, and endometrial cancer later in life. Breastfeeding basics Starting breastfeeding  Find a comfortable place to sit or lie down, with your neck and back well-supported.  Place a pillow or a rolled-up blanket under your baby to bring him or her to the level of your breast (if you are seated). Nursing pillows are specially designed to help support your arms and your baby while you breastfeed.  Make sure that your baby's tummy (abdomen) is facing your abdomen.  Gently massage your breast. With your fingertips, massage from the outer edges of your breast inward toward the nipple. This encourages milk flow. If your milk flows slowly, you may need to continue this action during the feeding.  Support your breast with 4 fingers underneath and your thumb above your nipple (make the letter "C" with your hand). Make sure your fingers are well away from your nipple and your baby's mouth.  Stroke your baby's lips gently with your finger or nipple.  When your baby's mouth is open wide enough, quickly bring your baby to your breast, placing your entire nipple and as much of the areola as possible into your baby's mouth. The areola is the colored area around your nipple. ? More areola should be visible above your baby's upper lip than below the lower lip. ? Your baby's lips should be opened and extended outward (flanged) to ensure an adequate, comfortable latch. ? Your baby's tongue should be between his or her lower gum and your breast.  Make sure that your baby's mouth is correctly positioned around your nipple (latched). Your baby's lips should create a seal on your  breast and be turned out (everted).  It is common for your baby to suck about 2-3 minutes in order to start the flow of breast milk. Latching Teaching your baby how to latch onto your breast properly is very important. An improper latch can cause nipple pain, decreased milk supply, and poor weight gain in your baby. Also, if your baby is not latched onto your nipple properly, he or she may swallow some air during feeding. This can make your baby fussy. Burping your baby when you switch breasts during the feeding can help to get rid of the air. However, teaching your baby to latch on properly is still the best way to prevent fussiness from swallowing air while breastfeeding. Signs that your baby has successfully latched onto your nipple  Silent tugging or silent sucking, without causing you pain. Infant's lips should be extended outward (flanged).  Swallowing heard between every 3-4 sucks once your milk has started to flow (after your let-down milk reflex occurs).  Muscle movement above and in front of his or her   ears while sucking. Signs that your baby has not successfully latched onto your nipple  Sucking sounds or smacking sounds from your baby while breastfeeding.  Nipple pain. If you think your baby has not latched on correctly, slip your finger into the corner of your baby's mouth to break the suction and place it between your baby's gums. Attempt to start breastfeeding again. Signs of successful breastfeeding Signs from your baby  Your baby will gradually decrease the number of sucks or will completely stop sucking.  Your baby will fall asleep.  Your baby's body will relax.  Your baby will retain a small amount of milk in his or her mouth.  Your baby will let go of your breast by himself or herself. Signs from you  Breasts that have increased in firmness, weight, and size 1-3 hours after feeding.  Breasts that are softer immediately after breastfeeding.  Increased milk  volume, as well as a change in milk consistency and color by the fifth day of breastfeeding.  Nipples that are not sore, cracked, or bleeding. Signs that your baby is getting enough milk  Wetting at least 1-2 diapers during the first 24 hours after birth.  Wetting at least 5-6 diapers every 24 hours for the first week after birth. The urine should be clear or pale yellow by the age of 5 days.  Wetting 6-8 diapers every 24 hours as your baby continues to grow and develop.  At least 3 stools in a 24-hour period by the age of 5 days. The stool should be soft and yellow.  At least 3 stools in a 24-hour period by the age of 7 days. The stool should be seedy and yellow.  No loss of weight greater than 10% of birth weight during the first 3 days of life.  Average weight gain of 4-7 oz (113-198 g) per week after the age of 4 days.  Consistent daily weight gain by the age of 5 days, without weight loss after the age of 2 weeks. After a feeding, your baby may spit up a small amount of milk. This is normal. Breastfeeding frequency and duration Frequent feeding will help you make more milk and can prevent sore nipples and extremely full breasts (breast engorgement). Breastfeed when you feel the need to reduce the fullness of your breasts or when your baby shows signs of hunger. This is called "breastfeeding on demand." Signs that your baby is hungry include:  Increased alertness, activity, or restlessness.  Movement of the head from side to side.  Opening of the mouth when the corner of the mouth or cheek is stroked (rooting).  Increased sucking sounds, smacking lips, cooing, sighing, or squeaking.  Hand-to-mouth movements and sucking on fingers or hands.  Fussing or crying. Avoid introducing a pacifier to your baby in the first 4-6 weeks after your baby is born. After this time, you may choose to use a pacifier. Research has shown that pacifier use during the first year of a baby's life  decreases the risk of sudden infant death syndrome (SIDS). Allow your baby to feed on each breast as long as he or she wants. When your baby unlatches or falls asleep while feeding from the first breast, offer the second breast. Because newborns are often sleepy in the first few weeks of life, you may need to awaken your baby to get him or her to feed. Breastfeeding times will vary from baby to baby. However, the following rules can serve as a guide to   help you make sure that your baby is properly fed:  Newborns (babies 4 weeks of age or younger) may breastfeed every 1-3 hours.  Newborns should not go without breastfeeding for longer than 3 hours during the day or 5 hours during the night.  You should breastfeed your baby a minimum of 8 times in a 24-hour period. Breast milk pumping     Pumping and storing breast milk allows you to make sure that your baby is exclusively fed your breast milk, even at times when you are unable to breastfeed. This is especially important if you go back to work while you are still breastfeeding, or if you are not able to be present during feedings. Your lactation consultant can help you find a method of pumping that works best for you and give you guidelines about how long it is safe to store breast milk. Caring for your breasts while you breastfeed Nipples can become dry, cracked, and sore while breastfeeding. The following recommendations can help keep your breasts moisturized and healthy:  Avoid using soap on your nipples.  Wear a supportive bra designed especially for nursing. Avoid wearing underwire-style bras or extremely tight bras (sports bras).  Air-dry your nipples for 3-4 minutes after each feeding.  Use only cotton bra pads to absorb leaked breast milk. Leaking of breast milk between feedings is normal.  Use lanolin on your nipples after breastfeeding. Lanolin helps to maintain your skin's normal moisture barrier. Pure lanolin is not harmful (not  toxic) to your baby. You may also hand express a few drops of breast milk and gently massage that milk into your nipples and allow the milk to air-dry. In the first few weeks after giving birth, some women experience breast engorgement. Engorgement can make your breasts feel heavy, warm, and tender to the touch. Engorgement peaks within 3-5 days after you give birth. The following recommendations can help to ease engorgement:  Completely empty your breasts while breastfeeding or pumping. You may want to start by applying warm, moist heat (in the shower or with warm, water-soaked hand towels) just before feeding or pumping. This increases circulation and helps the milk flow. If your baby does not completely empty your breasts while breastfeeding, pump any extra milk after he or she is finished.  Apply ice packs to your breasts immediately after breastfeeding or pumping, unless this is too uncomfortable for you. To do this: ? Put ice in a plastic bag. ? Place a towel between your skin and the bag. ? Leave the ice on for 20 minutes, 2-3 times a day.  Make sure that your baby is latched on and positioned properly while breastfeeding. If engorgement persists after 48 hours of following these recommendations, contact your health care provider or a lactation consultant. Overall health care recommendations while breastfeeding  Eat 3 healthy meals and 3 snacks every day. Well-nourished mothers who are breastfeeding need an additional 450-500 calories a day. You can meet this requirement by increasing the amount of a balanced diet that you eat.  Drink enough water to keep your urine pale yellow or clear.  Rest often, relax, and continue to take your prenatal vitamins to prevent fatigue, stress, and low vitamin and mineral levels in your body (nutrient deficiencies).  Do not use any products that contain nicotine or tobacco, such as cigarettes and e-cigarettes. Your baby may be harmed by chemicals from  cigarettes that pass into breast milk and exposure to secondhand smoke. If you need help quitting, ask your   health care provider.  Avoid alcohol.  Do not use illegal drugs or marijuana.  Talk with your health care provider before taking any medicines. These include over-the-counter and prescription medicines as well as vitamins and herbal supplements. Some medicines that may be harmful to your baby can pass through breast milk.  It is possible to become pregnant while breastfeeding. If birth control is desired, ask your health care provider about options that will be safe while breastfeeding your baby. Where to find more information: [employer]: www.llli.org Contact a health care provider if:  You feel like you want to stop breastfeeding or have become frustrated with breastfeeding.  Your nipples are cracked or bleeding.  Your breasts are red, tender, or warm.  You have: ? Painful breasts or nipples. ? A swollen area on either breast. ? A fever or chills. ? Nausea or vomiting. ? Drainage other than breast milk from your nipples.  Your breasts do not become full before feedings by the fifth day after you give birth.  You feel sad and depressed.  Your baby is: ? Too sleepy to eat well. ? Having trouble sleeping. ? More than 1 week old and wetting fewer than 6 diapers in a 24-hour period. ? Not gaining weight by 5 days of age.  Your baby has fewer than 3 stools in a 24-hour period.  Your baby's skin or the white parts of his or her eyes become yellow. Get help right away if:  Your baby is overly tired (lethargic) and does not want to wake up and feed.  Your baby develops an unexplained fever. Summary  Breastfeeding offers many health benefits for infant and mothers.  Try to breastfeed your infant when he or she shows early signs of hunger.  Gently tickle or stroke your baby's lips with your finger or nipple to allow the baby to open his or her mouth.  Bring the baby to your breast. Make sure that much of the areola is in your baby's mouth. Offer one side and burp the baby before you offer the other side.  Talk with your health care provider or lactation consultant if you have questions or you face problems as you breastfeed. This information is not intended to replace advice given to you by your health care provider. Make sure you discuss any questions you have with your health care provider. Document Revised: 07/15/2017 Document Reviewed: 05/22/2016 Elsevier Patient Education  2020 [employer] Inc.  

## 2019-12-08 ENCOUNTER — Ambulatory Visit (INDEPENDENT_AMBULATORY_CARE_PROVIDER_SITE_OTHER): Payer: Medicaid Other | Admitting: Obstetrics and Gynecology

## 2019-12-08 ENCOUNTER — Other Ambulatory Visit: Payer: Self-pay

## 2019-12-08 VITALS — BP 101/65 | HR 92 | Wt 121.7 lb

## 2019-12-08 DIAGNOSIS — O0990 Supervision of high risk pregnancy, unspecified, unspecified trimester: Secondary | ICD-10-CM

## 2019-12-08 DIAGNOSIS — O24410 Gestational diabetes mellitus in pregnancy, diet controlled: Secondary | ICD-10-CM

## 2019-12-08 DIAGNOSIS — Z3A35 35 weeks gestation of pregnancy: Secondary | ICD-10-CM

## 2019-12-08 NOTE — Progress Notes (Signed)
Pt left Glucose log in car, states will take picture & send thru My Chart.

## 2019-12-10 NOTE — Progress Notes (Signed)
Prenatal Visit Note Date: 12/08/2019 Clinic: [facility]  Subjective:  Felicia Clark is a 24 y.o. G2P0100 at [redacted]w[redacted]d being seen today for ongoing prenatal care.  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  Patient reports no complaints.   Contractions: Not present. Vag. Bleeding: None.  Movement: Present. Denies leaking of fluid.   The following portions of the patient's history were reviewed and updated as appropriate: allergies, current medications, past family history, past medical history, past social history, past surgical history and problem list. Problem list updated.  Objective:   Vitals:   12/08/19 1103  BP: 101/65  Pulse: 92  Weight: 121 lb 11.2 oz (55.2 kg)    Fetal Status: Fetal Heart Rate (bpm): 158   Movement: Present     General:  Alert, oriented and cooperative. Patient is in no acute distress.  Skin: Skin is warm and dry. No rash noted.   Cardiovascular: Normal heart rate noted  Respiratory: Normal respiratory effort, no problems with respiration noted  Abdomen: Soft, gravid, appropriate for gestational age. Pain/Pressure: Present     Pelvic:  Cervical exam deferred        Extremities: Normal range of motion.  Edema: Trace  Mental Status: Normal mood and affect. Normal behavior. Normal judgment and thought content.   Urinalysis:      Assessment and Plan:  Pregnancy: G2P0100 at [redacted]w[redacted]d  1. Diet controlled gestational diabetes mellitus (GDM) in third trimester Gives normal CBG log. Has rpt growth on 8/13. gbs nv  2. Supervision of high risk pregnancy, antepartum Routine care.   Term labor symptoms and general obstetric precautions including but not limited to vaginal bleeding, contractions,  leaking of fluid and fetal movement were reviewed in detail with the patient. Please refer to After Visit Summary for other counseling recommendations.  Return in about 1 week (around 12/15/2019) for high risk, in person.   Aletha Halim, MD

## 2019-12-12 ENCOUNTER — Encounter: Payer: Self-pay | Admitting: Family Medicine

## 2019-12-15 ENCOUNTER — Ambulatory Visit: Payer: Medicaid Other

## 2019-12-18 ENCOUNTER — Other Ambulatory Visit: Payer: Self-pay

## 2019-12-18 ENCOUNTER — Ambulatory Visit (INDEPENDENT_AMBULATORY_CARE_PROVIDER_SITE_OTHER): Payer: Medicaid Other | Admitting: Family Medicine

## 2019-12-18 ENCOUNTER — Other Ambulatory Visit (HOSPITAL_COMMUNITY)
Admission: RE | Admit: 2019-12-18 | Discharge: 2019-12-18 | Disposition: A | Discharge status: Home / Self Care | Payer: Medicaid Other | Source: Ambulatory Visit | Attending: Family Medicine | Admitting: Family Medicine

## 2019-12-18 VITALS — BP 105/69 | HR 90 | Wt 122.0 lb

## 2019-12-18 DIAGNOSIS — O0990 Supervision of high risk pregnancy, unspecified, unspecified trimester: Secondary | ICD-10-CM | POA: Insufficient documentation

## 2019-12-18 DIAGNOSIS — O24410 Gestational diabetes mellitus in pregnancy, diet controlled: Secondary | ICD-10-CM

## 2019-12-18 NOTE — Progress Notes (Signed)
° °  PRENATAL VISIT NOTE  Subjective:  Felicia Clark is a 24 y.o. G2P0100 at [redacted]w[redacted]d being seen today for ongoing prenatal care.  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  Patient reports no complaints.  Contractions: Not present. Vag. Bleeding: None.  Movement: Present. Denies leaking of fluid.   The following portions of the patient's history were reviewed and updated as appropriate: allergies, current medications, past family history, past medical history, past social history, past surgical history and problem list.   Objective:   Vitals:   12/18/19 0821  BP: 105/69  Pulse: 90  Weight: 122 lb (55.3 kg)    Fetal Status: Fetal Heart Rate (bpm): 152 Fundal Height: 33 cm Movement: Present  Presentation: Vertex  General:  Alert, oriented and cooperative. Patient is in no acute distress.  Skin: Skin is warm and dry. No rash noted.   Cardiovascular: Normal heart rate noted  Respiratory: Normal respiratory effort, no problems with respiration noted  Abdomen: Soft, gravid, appropriate for gestational age.  Pain/Pressure: Absent     Pelvic: Cervical exam performed in the presence of a chaperone Dilation: 1 Effacement (%): 40 Station: -2  Extremities: Normal range of motion.  Edema: Trace  Mental Status: Normal mood and affect. Normal behavior. Normal judgment and thought content.   Assessment and Plan:  Pregnancy: G2P0100 at [redacted]w[redacted]d 1. Diet controlled gestational diabetes mellitus (GDM) in third trimester No book reports FBS 70s, 2 hour pp 90-110s, will send picture to MyChart  2. Supervision of high risk pregnancy, antepartum Cultures today - Strep Gp B NAA - Cervicovaginal ancillary only( [place])  Preterm labor symptoms and general  obstetric precautions including but not limited to vaginal bleeding, contractions, leaking of fluid and fetal movement were reviewed in detail with the patient. Please refer to After Visit Summary for other counseling recommendations.   Return in 1 week (on 12/25/2019) for [facility], in person.  Future Appointments  Date Time Provider [place]  12/21/2019  3:30 PM [facility] NURSE [facility]  12/21/2019  3:45 PM [facility] US4 [facility]  12/25/2019  8:00 AM Aletha Halim, MD [facility]  01/01/2020  9:55 AM Harolyn Rutherford Sallyanne Havers, MD [facility]  01/10/2020  8:55 AM Woodroe Mode, MD [facility]    Donnamae Jude, MD

## 2019-12-18 NOTE — Patient Instructions (Signed)

## 2019-12-19 LAB — POCT URINALYSIS DIP (DEVICE)
Bilirubin Urine: NEGATIVE
Glucose, UA: NEGATIVE mg/dL
Hgb urine dipstick: NEGATIVE
Ketones, ur: NEGATIVE mg/dL
Nitrite: NEGATIVE
Protein, ur: NEGATIVE mg/dL
Specific Gravity, Urine: 1.02 (ref 1.005–1.030)
Urobilinogen, UA: 0.2 mg/dL (ref 0.0–1.0)
pH: 6 (ref 5.0–8.0)

## 2019-12-19 LAB — CERVICOVAGINAL ANCILLARY ONLY
Chlamydia: NEGATIVE
Comment: NEGATIVE
Comment: NORMAL
Neisseria Gonorrhea: NEGATIVE

## 2019-12-20 LAB — STREP GP B NAA: Strep Gp B NAA: NEGATIVE

## 2019-12-21 ENCOUNTER — Ambulatory Visit: Payer: Medicaid Other | Admitting: *Deleted

## 2019-12-21 ENCOUNTER — Other Ambulatory Visit: Payer: Self-pay

## 2019-12-21 ENCOUNTER — Ambulatory Visit: Payer: Medicaid Other | Attending: Obstetrics and Gynecology

## 2019-12-21 DIAGNOSIS — O09293 Supervision of pregnancy with other poor reproductive or obstetric history, third trimester: Secondary | ICD-10-CM

## 2019-12-21 DIAGNOSIS — O0990 Supervision of high risk pregnancy, unspecified, unspecified trimester: Secondary | ICD-10-CM | POA: Insufficient documentation

## 2019-12-21 DIAGNOSIS — O358XX0 Maternal care for other (suspected) fetal abnormality and damage, not applicable or unspecified: Secondary | ICD-10-CM | POA: Diagnosis not present

## 2019-12-21 DIAGNOSIS — O352XX0 Maternal care for (suspected) hereditary disease in fetus, not applicable or unspecified: Secondary | ICD-10-CM | POA: Diagnosis not present

## 2019-12-21 DIAGNOSIS — Z362 Encounter for other antenatal screening follow-up: Secondary | ICD-10-CM

## 2019-12-21 DIAGNOSIS — O24410 Gestational diabetes mellitus in pregnancy, diet controlled: Secondary | ICD-10-CM | POA: Diagnosis not present

## 2019-12-21 DIAGNOSIS — Z8759 Personal history of other complications of pregnancy, childbirth and the puerperium: Secondary | ICD-10-CM

## 2019-12-21 DIAGNOSIS — Z3A37 37 weeks gestation of pregnancy: Secondary | ICD-10-CM | POA: Diagnosis not present

## 2019-12-25 ENCOUNTER — Other Ambulatory Visit (HOSPITAL_COMMUNITY): Payer: Self-pay | Admitting: Advanced Practice Midwife

## 2019-12-25 ENCOUNTER — Other Ambulatory Visit: Payer: Self-pay

## 2019-12-25 ENCOUNTER — Ambulatory Visit (INDEPENDENT_AMBULATORY_CARE_PROVIDER_SITE_OTHER): Payer: Medicaid Other | Admitting: Obstetrics and Gynecology

## 2019-12-25 VITALS — BP 105/72 | HR 106 | Wt 122.0 lb

## 2019-12-25 DIAGNOSIS — O0990 Supervision of high risk pregnancy, unspecified, unspecified trimester: Secondary | ICD-10-CM

## 2019-12-25 DIAGNOSIS — Z3A37 37 weeks gestation of pregnancy: Secondary | ICD-10-CM

## 2019-12-25 DIAGNOSIS — O24410 Gestational diabetes mellitus in pregnancy, diet controlled: Secondary | ICD-10-CM

## 2019-12-25 LAB — POCT URINALYSIS DIP (DEVICE)
Bilirubin Urine: NEGATIVE
Glucose, UA: NEGATIVE mg/dL
Ketones, ur: NEGATIVE mg/dL
Nitrite: NEGATIVE
Protein, ur: NEGATIVE mg/dL
Specific Gravity, Urine: 1.02 (ref 1.005–1.030)
Urobilinogen, UA: 0.2 mg/dL (ref 0.0–1.0)
pH: 7 (ref 5.0–8.0)

## 2019-12-25 NOTE — Progress Notes (Signed)
Prenatal Visit Note Date: 12/25/2019 Clinic: [facility]  Subjective:  Felicia Clark is a 24 y.o. G2P0100 at [redacted]w[redacted]d being seen today for ongoing prenatal care.  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  Patient reports occasional contractions.   Contractions: Irritability. Vag. Bleeding: None.  Movement: Present. Denies leaking of fluid.   The following portions of the patient's history were reviewed and updated as appropriate: allergies, current medications, past family history, past medical history, past social history, past surgical history and problem list. Problem list updated.  Objective:   Vitals:   12/25/19 0820  BP: 105/72  Pulse: (!) 106  Weight: 122 lb (55.3 kg)    Fetal Status: Fetal Heart Rate (bpm): 155   Movement: Present  Presentation: Vertex  General:  Alert, oriented and cooperative. Patient is in no acute distress.  Skin: Skin is warm and dry. No rash noted.   Cardiovascular: Normal heart rate noted  Respiratory: Normal respiratory effort, no problems with respiration noted  Abdomen: Soft, gravid, appropriate for gestational age. Pain/Pressure: Present     Pelvic:  Cervical exam deferred        Extremities: Normal range of motion.  Edema: Trace  Mental Status: Normal mood and affect. Normal behavior. Normal judgment and thought content.   Urinalysis:      Assessment and Plan:  Pregnancy: G2P0100 at [redacted]w[redacted]d  1. [redacted] weeks gestation of pregnancy  2. Diet controlled gestational diabetes mellitus (GDM) in third trimester Routine care. efw 44%, 2966gm, ac 28%, afi 14, cephalic on 7/86. See patient message but has normal cbg log book. D/w her re: 39-40wk iol and pt amenable to this. Will set up  today  3. Supervision of high risk pregnancy, antepartum Routine care. gbs neg  Term labor symptoms and general obstetric precautions including but not limited to vaginal bleeding, contractions, leaking of fluid and fetal movement were reviewed in detail with the patient. Please refer to After Visit Summary for other counseling recommendations.  Return in about 1 week (around 01/01/2020) for low risk, high risk, in person.   Aletha Halim, MD

## 2019-12-25 NOTE — Progress Notes (Signed)
IOL scheduled for 9/2 at midnight.

## 2019-12-28 ENCOUNTER — Encounter (HOSPITAL_COMMUNITY): Payer: Self-pay | Admitting: *Deleted

## 2019-12-28 ENCOUNTER — Telehealth (HOSPITAL_COMMUNITY): Payer: Self-pay | Admitting: *Deleted

## 2019-12-28 NOTE — Telephone Encounter (Signed)
Preadmission screen  

## 2020-01-01 ENCOUNTER — Ambulatory Visit (INDEPENDENT_AMBULATORY_CARE_PROVIDER_SITE_OTHER): Payer: Medicaid Other | Admitting: Obstetrics & Gynecology

## 2020-01-01 ENCOUNTER — Other Ambulatory Visit: Payer: Self-pay

## 2020-01-01 ENCOUNTER — Encounter: Payer: Self-pay | Admitting: Obstetrics & Gynecology

## 2020-01-01 ENCOUNTER — Other Ambulatory Visit (HOSPITAL_COMMUNITY)
Admission: RE | Admit: 2020-01-01 | Discharge: 2020-01-01 | Disposition: A | Discharge status: Home / Self Care | Payer: Medicaid Other | Source: Ambulatory Visit | Attending: Obstetrics & Gynecology | Admitting: Obstetrics & Gynecology

## 2020-01-01 VITALS — BP 101/68 | HR 89 | Wt 122.0 lb

## 2020-01-01 DIAGNOSIS — O0990 Supervision of high risk pregnancy, unspecified, unspecified trimester: Secondary | ICD-10-CM

## 2020-01-01 DIAGNOSIS — Z01812 Encounter for preprocedural laboratory examination: Secondary | ICD-10-CM | POA: Diagnosis not present

## 2020-01-01 DIAGNOSIS — Z20822 Contact with and (suspected) exposure to covid-19: Secondary | ICD-10-CM | POA: Diagnosis not present

## 2020-01-01 DIAGNOSIS — O24419 Gestational diabetes mellitus in pregnancy, unspecified control: Secondary | ICD-10-CM

## 2020-01-01 DIAGNOSIS — Z3A38 38 weeks gestation of pregnancy: Secondary | ICD-10-CM

## 2020-01-01 LAB — SARS CORONAVIRUS 2 (TAT 6-24 HRS): SARS Coronavirus 2: NEGATIVE

## 2020-01-01 NOTE — Patient Instructions (Signed)
Balloon Catheter Placement for Cervical Ripening Balloon catheter placement for cervical ripening is a procedure to help your cervix start to soften (ripen) and open (dilate). It is done to prepare your body for labor induction. During this procedure, a thin tube (catheter) is placed through your cervix. A tiny balloon attached to the catheter is inflated with water. Pressure from the balloon is what helps your cervix start to open. Cervical ripening with a balloon catheter can make labor induction shorter and easier. You may have this procedure if:  Your cervix is not ready for labor.  Your health care provider has planned labor induction.  You are not having twins or multiples.  Your baby is in the head-down position.  You do not have any other pregnancy complications that require you to be monitored in the hospital after balloon catheter placement. If your health care provider has recommended labor induction to stimulate a vaginal birth, this procedure may be started the day before induction. You will go home with the balloon in place and return to start induction in 12-24 hours. You may have this procedure and stay in the hospital so that your progress can be monitored as well. Tell a health care provider about:  Any allergies you have.  All medicines you are taking, including vitamins, herbs, eye drops, creams, and over-the-counter medicines.  Any blood disorders you have.  Any surgeries you have had.  Any medical conditions you have. What are the risks? Generally, this is a safe procedure. However, problems may occur, including:  Infection.  Bleeding.  Cramping or pain.  Difficulty passing urine.  The baby moving from the head-down position to a position with the feet or buttocks down (breech position). What happens before the procedure?  Your health care provider may check your baby's heartbeat (fetal monitoring) before the procedure.  You may be asked to empty your  bladder. What happens during the procedure?   You will be positioned on the exam table as if you were having a pelvic exam or Pap test.  Your health care provider may insert a medical instrument into your vagina (speculum) to see your cervix.  Your cervix may be cleaned with a germ-killing solution.  The catheter will be inserted through the opening of your cervix.  A balloon on the end of the catheter will be inflated with sterile water. Some catheters have two balloons, one on each side of the cervix.  Depending on the type of balloon catheter, the end of the catheter may be left free outside your cervix or taped to your leg. The procedure may vary among health care providers and hospitals. What can I expect after the procedure?  After the procedure, it is common to have: ? A feeling of pressure inside your vagina. ? Light vaginal bleeding (spotting).  You may have fetal monitoring before going home.  You may be sent home with the catheter in place and asked to return to start your induction in about 12-24 hours. Follow these instructions at home:  Take over-the-counter and prescription medicines only as told by your health care provider.  Return to your normal activities at home as told by your health care provider. Ask your health care provider what activities are safe for you. Do not leave home until you return for labor induction.  You may shower at home. Do not take baths, swim, or use a hot tub unless your health care provider approves.  As your cervix opens, your catheter and balloon may  fall out before you return for labor induction. Ask your health care provider what you should do if this happens.  Keep all follow-up visits as told by your health care provider. This is important. You will need to return to start induction as told by your health care provider. Contact your health care provider if:  You have chills or a fever.  You have constant pain or cramps (not  contractions).  You have trouble passing urine.  Your water breaks.  You have vaginal bleeding that is heavier than spotting.  You have contractions that start to last longer and come closer together (about every 5 minutes).  The balloon catheter falls out before you return to start your induction. Summary  Cervical ripening with placement of a balloon catheter is an outpatient procedure to prepare you for labor induction.  Cervical ripening with a balloon catheter helps your cervix start to open for birth.  You will be positioned on the exam table. The catheter will be inserted through the opening of your cervix. A balloon on the end of the catheter will be inflated with water.  Pressure from the balloon will cause ripening of your cervix. You will go home with the balloon in place and return to start induction in 12-24 hours.  Contact your health care provider if you have pain, fever, vaginal bleeding, or trouble passing urine. Also contact him or her if your water breaks, you start to go into labor, or your balloon catheter falls out before you return to start your induction. This information is not intended to replace advice given to you by your health care provider. Make sure you discuss any questions you have with your health care provider. Document Revised: 12/17/2017 Document Reviewed: 12/17/2017 Elsevier Patient Education  [place] induction is when steps are taken to cause a pregnant woman to begin the labor process. Most women go into labor on their own between 37 weeks and 42 weeks of pregnancy. When this does not happen or when there is a medical need for labor to begin, steps may be taken to induce labor. Labor induction causes a pregnant woman's uterus to contract. It also causes the cervix to soften (ripen), open (dilate), and thin out (efface). Usually, labor is not induced before 39 weeks of pregnancy unless there is a medical reason  to do so. Your health care provider will determine if labor induction is needed. Before inducing labor, your health care provider will consider a number of factors, including:  Your medical condition and your baby's.  How many weeks along you are in your pregnancy.  How mature your baby's lungs are.  The condition of your cervix.  The position of your baby.  The size of your birth canal. What are some reasons for labor induction? Labor may be induced if:  Your health or your baby's health is at risk.  Your pregnancy is overdue by 1 week or more.  Your water breaks but labor does not start on its own.  There is a low amount of amniotic fluid around your baby. You may also choose (elect) to have labor induced at a certain time. Generally, elective labor induction is done no earlier than 39 weeks of pregnancy. What methods are used for labor induction? Methods used for labor induction include:  Prostaglandin medicine. This medicine starts contractions and causes the cervix to dilate and ripen. It can be taken by mouth (orally) or by being inserted into  the vagina (suppository).  Inserting a small, thin tube (catheter) with a balloon into the vagina and then expanding the balloon with water to dilate the cervix.  Stripping the membranes. In this method, your health care provider gently separates amniotic sac tissue from the cervix. This causes the cervix to stretch, which in turn causes the release of a hormone called progesterone. The hormone causes the uterus to contract. This procedure is often done during an office visit, after which you will be sent home to wait for contractions to begin.  Breaking the water. In this method, your health care provider uses a small instrument to make a small hole in the amniotic sac. This eventually causes the amniotic sac to break. Contractions should begin after a few hours.  Medicine to trigger or strengthen contractions. This medicine is given  through an IV that is inserted into a vein in your arm. Except for membrane stripping, which can be done in a clinic, labor induction is done in the hospital so that you and your baby can be carefully monitored. How long does it take for labor to be induced? The length of time it takes to induce labor depends on how ready your body is for labor. Some inductions can take up to 2-3 days, while others may take less than a day. Induction may take longer if:  You are induced early in your pregnancy.  It is your first pregnancy.  Your cervix is not ready. What are some risks associated with labor induction? Some risks associated with labor induction include:  Changes in fetal heart rate, such as being too high, too low, or irregular (erratic).  Failed induction.  Infection in the mother or the baby.  Increased risk of having a cesarean delivery.  Fetal death.  Breaking off (abruption) of the placenta from the uterus (rare).  Rupture of the uterus (very rare). When induction is needed for medical reasons, the benefits of induction generally outweigh the risks. What are some reasons for not inducing labor? Labor induction should not be done if:  Your baby does not tolerate contractions.  You have had previous surgeries on your uterus, such as a myomectomy, removal of fibroids, or a vertical scar from a previous cesarean delivery.  Your placenta lies very low in your uterus and blocks the opening of the cervix (placenta previa).  Your baby is not in a head-down position.  The umbilical cord drops down into the birth canal in front of the baby.  There are unusual circumstances, such as the baby being very early (premature).  You have had more than 2 previous cesarean deliveries. Summary  Labor induction is when steps are taken to cause a pregnant woman to begin the labor process.  Labor induction causes a pregnant woman's uterus to contract. It also causes the cervix to ripen,  dilate, and efface.  Labor is not induced before 39 weeks of pregnancy unless there is a medical reason to do so.  When induction is needed for medical reasons, the benefits of induction generally outweigh the risks. This information is not intended to replace advice given to you by your health care provider. Make sure you discuss any questions you have with your health care provider. Document Revised: 04/23/2017 Document Reviewed: 06/03/2016 Elsevier Patient Education  2020 [employer].

## 2020-01-01 NOTE — Progress Notes (Signed)
   PRENATAL VISIT NOTE  Subjective:  Felicia Clark is a 24 y.o. G2P0100 at [redacted]w[redacted]d being seen today for ongoing prenatal care.  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  Patient reports no complaints.  Contractions: Irritability. Vag. Bleeding: None.  Movement: Present. Denies leaking of fluid.   The following portions of the patient's history were reviewed and updated as appropriate: allergies, current medications, past family history, past medical history, past social history, past surgical history and problem list.   Objective:   Vitals:   01/01/20 1010  BP: 101/68  Pulse: 89  Weight: 122 lb (55.3 kg)    Fetal Status:     Movement: Present     General:  Alert, oriented and cooperative. Patient is in no acute distress.  Skin: Skin is warm and dry. No rash noted.   Cardiovascular: Normal heart rate noted  Respiratory: Normal respiratory effort, no problems with respiration noted  Abdomen: Soft, gravid, appropriate for gestational age.  Pain/Pressure: Present     Pelvic: Cervical exam deferred        Extremities: Normal range of motion.  Edema: Trace  Mental Status: Normal mood and affect. Normal behavior. Normal judgment and thought content.   Assessment and Plan:  Pregnancy: G2P0100 at [redacted]w[redacted]d 1. Gestational diabetes mellitus (GDM), antepartum, gestational diabetes method of control unspecified Stable blood sugars reported by patient.  IOL already scheduled at 39 weeks, orders in place. Risks and benefits of induction were reviewed, including failure of method, prolonged labor, need for further intervention, risk of cesarean section.  Patient understand these risks and wish to proceed.  Options of misoprostol foley bulb, artificial  rupture of membranes, and pitocin reviewed, with use of each discussed in detail. All questions answered.  Induction of labor scheduled, orders have been signed and held. Offered outpatient foley placement for cervical ripening, she declined this.   2. [redacted] weeks gestation of pregnancy 3. Supervision of high risk pregnancy, antepartum Term labor symptoms and general obstetric precautions including but not limited to vaginal bleeding, contractions, leaking of fluid and fetal movement were reviewed in detail with the patient. Please refer to After Visit Summary for other counseling recommendations.   Return for Postpartum check.  Future Appointments  Date Time Provider [place]  01/04/2020 12:00 AM [facility] SCHED ROOM [facility] None    Verita Schneiders, MD

## 2020-01-02 ENCOUNTER — Other Ambulatory Visit (HOSPITAL_COMMUNITY): Payer: Medicaid Other

## 2020-01-03 ENCOUNTER — Other Ambulatory Visit: Payer: Self-pay | Admitting: Advanced Practice Midwife

## 2020-01-04 ENCOUNTER — Inpatient Hospital Stay (HOSPITAL_COMMUNITY): Payer: Medicaid Other

## 2020-01-04 ENCOUNTER — Other Ambulatory Visit: Payer: Self-pay

## 2020-01-04 ENCOUNTER — Inpatient Hospital Stay (HOSPITAL_COMMUNITY)
Admission: AD | Admit: 2020-01-04 | Discharge: 2020-01-06 | DRG: 806 | Disposition: A | Discharge status: Home / Self Care | Payer: Medicaid Other | Attending: Obstetrics and Gynecology | Admitting: Obstetrics and Gynecology

## 2020-01-04 ENCOUNTER — Encounter (HOSPITAL_COMMUNITY): Payer: Self-pay | Admitting: Obstetrics and Gynecology

## 2020-01-04 DIAGNOSIS — O24410 Gestational diabetes mellitus in pregnancy, diet controlled: Secondary | ICD-10-CM | POA: Diagnosis present

## 2020-01-04 DIAGNOSIS — O24420 Gestational diabetes mellitus in childbirth, diet controlled: Secondary | ICD-10-CM | POA: Diagnosis not present

## 2020-01-04 DIAGNOSIS — Z8759 Personal history of other complications of pregnancy, childbirth and the puerperium: Secondary | ICD-10-CM

## 2020-01-04 DIAGNOSIS — O0990 Supervision of high risk pregnancy, unspecified, unspecified trimester: Secondary | ICD-10-CM

## 2020-01-04 DIAGNOSIS — Z3A39 39 weeks gestation of pregnancy: Secondary | ICD-10-CM | POA: Diagnosis not present

## 2020-01-04 DIAGNOSIS — Z3400 Encounter for supervision of normal first pregnancy, unspecified trimester: Secondary | ICD-10-CM

## 2020-01-04 LAB — CBC
HCT: 34.1 % — ABNORMAL LOW (ref 36.0–46.0)
Hemoglobin: 11.1 g/dL — ABNORMAL LOW (ref 12.0–15.0)
MCH: 28.2 pg (ref 26.0–34.0)
MCHC: 32.6 g/dL (ref 30.0–36.0)
MCV: 86.8 fL (ref 80.0–100.0)
Platelets: 346 10*3/uL (ref 150–400)
RBC: 3.93 MIL/uL (ref 3.87–5.11)
RDW: 13.8 % (ref 11.5–15.5)
WBC: 7.6 10*3/uL (ref 4.0–10.5)
nRBC: 0 % (ref 0.0–0.2)

## 2020-01-04 LAB — TYPE AND SCREEN
ABO/RH(D): O POS
Antibody Screen: NEGATIVE

## 2020-01-04 LAB — GLUCOSE, CAPILLARY
Glucose-Capillary: 101 mg/dL — ABNORMAL HIGH (ref 70–99)
Glucose-Capillary: 110 mg/dL — ABNORMAL HIGH (ref 70–99)
Glucose-Capillary: 115 mg/dL — ABNORMAL HIGH (ref 70–99)

## 2020-01-04 LAB — RPR: RPR Ser Ql: NONREACTIVE

## 2020-01-04 MED ORDER — LACTATED RINGERS IV SOLN: 500.0000 mL | INTRAVENOUS | Status: DC | PRN

## 2020-01-04 MED ORDER — MISOPROSTOL 25 MCG QUARTER TABLET
ORAL_TABLET | ORAL | Status: AC
  Filled 2020-01-04: qty 1

## 2020-01-04 MED ORDER — MISOPROSTOL 100 MCG PO TABS
25.0000 ug | ORAL_TABLET | ORAL | Status: DC
  Administered 2020-01-04: 25 ug via VAGINAL

## 2020-01-04 MED ORDER — ACETAMINOPHEN 325 MG PO TABS: 650.0000 mg | ORAL_TABLET | ORAL | Status: DC | PRN

## 2020-01-04 MED ORDER — TETANUS-DIPHTH-ACELL PERTUSSIS 5-2.5-18.5 LF-MCG/0.5 IM SUSP: 0.5000 mL | Freq: Once | INTRAMUSCULAR | Status: DC

## 2020-01-04 MED ORDER — LACTATED RINGERS IV SOLN: INTRAVENOUS | Status: DC

## 2020-01-04 MED ORDER — SIMETHICONE 80 MG PO CHEW: 80.0000 mg | CHEWABLE_TABLET | ORAL | Status: DC | PRN

## 2020-01-04 MED ORDER — COCONUT OIL OIL: 1.0000 "application " | TOPICAL_OIL | Status: DC | PRN

## 2020-01-04 MED ORDER — ONDANSETRON HCL 4 MG PO TABS: 4.0000 mg | ORAL_TABLET | ORAL | Status: DC | PRN

## 2020-01-04 MED ORDER — TERBUTALINE SULFATE 1 MG/ML IJ SOLN: 0.2500 mg | Freq: Once | INTRAMUSCULAR | Status: DC | PRN

## 2020-01-04 MED ORDER — BENZOCAINE-MENTHOL 20-0.5 % EX AERO
1.0000 "application " | INHALATION_SPRAY | CUTANEOUS | Status: DC | PRN
  Administered 2020-01-04: 1 via TOPICAL
  Filled 2020-01-04: qty 56

## 2020-01-04 MED ORDER — ONDANSETRON HCL 4 MG/2ML IJ SOLN: 4.0000 mg | INTRAMUSCULAR | Status: DC | PRN

## 2020-01-04 MED ORDER — OXYTOCIN-SODIUM CHLORIDE 30-0.9 UT/500ML-% IV SOLN
2.5000 [IU]/h | INTRAVENOUS | Status: DC
  Administered 2020-01-04: 2.5 [IU]/h via INTRAVENOUS
  Filled 2020-01-04: qty 500

## 2020-01-04 MED ORDER — OXYTOCIN BOLUS FROM INFUSION
333.0000 mL | Freq: Once | INTRAVENOUS | Status: AC
  Administered 2020-01-04: 333 mL via INTRAVENOUS

## 2020-01-04 MED ORDER — SOD CITRATE-CITRIC ACID 500-334 MG/5ML PO SOLN: 30.0000 mL | ORAL | Status: DC | PRN

## 2020-01-04 MED ORDER — LIDOCAINE HCL (PF) 1 % IJ SOLN
30.0000 mL | INTRAMUSCULAR | Status: AC | PRN
  Administered 2020-01-04: 30 mL via SUBCUTANEOUS
  Filled 2020-01-04 (×2): qty 30

## 2020-01-04 MED ORDER — DIBUCAINE (PERIANAL) 1 % EX OINT: 1.0000 "application " | TOPICAL_OINTMENT | CUTANEOUS | Status: DC | PRN

## 2020-01-04 MED ORDER — FENTANYL CITRATE (PF) 100 MCG/2ML IJ SOLN
50.0000 ug | INTRAMUSCULAR | Status: DC | PRN
  Administered 2020-01-04 (×2): 50 ug via INTRAVENOUS
  Filled 2020-01-04 (×2): qty 2

## 2020-01-04 MED ORDER — SENNOSIDES-DOCUSATE SODIUM 8.6-50 MG PO TABS
2.0000 | ORAL_TABLET | ORAL | Status: DC
  Administered 2020-01-05 (×2): 2 via ORAL
  Filled 2020-01-04 (×2): qty 2

## 2020-01-04 MED ORDER — PRENATAL MULTIVITAMIN CH
1.0000 | ORAL_TABLET | Freq: Every day | ORAL | Status: DC
  Administered 2020-01-05: 1 via ORAL
  Filled 2020-01-04: qty 1

## 2020-01-04 MED ORDER — ZOLPIDEM TARTRATE 5 MG PO TABS
5.0000 mg | ORAL_TABLET | Freq: Once | ORAL | Status: AC
  Administered 2020-01-04: 5 mg via ORAL
  Filled 2020-01-04: qty 1

## 2020-01-04 MED ORDER — ONDANSETRON HCL 4 MG/2ML IJ SOLN
4.0000 mg | Freq: Four times a day (QID) | INTRAMUSCULAR | Status: DC | PRN
  Administered 2020-01-04: 4 mg via INTRAVENOUS
  Filled 2020-01-04: qty 2

## 2020-01-04 MED ORDER — DIPHENHYDRAMINE HCL 25 MG PO CAPS: 25.0000 mg | ORAL_CAPSULE | Freq: Four times a day (QID) | ORAL | Status: DC | PRN

## 2020-01-04 MED ORDER — IBUPROFEN 600 MG PO TABS
600.0000 mg | ORAL_TABLET | Freq: Four times a day (QID) | ORAL | Status: DC
  Administered 2020-01-04 – 2020-01-06 (×7): 600 mg via ORAL
  Filled 2020-01-04 (×7): qty 1

## 2020-01-04 MED ORDER — WITCH HAZEL-GLYCERIN EX PADS: 1.0000 "application " | MEDICATED_PAD | CUTANEOUS | Status: DC | PRN

## 2020-01-04 MED ORDER — ACETAMINOPHEN 325 MG PO TABS
650.0000 mg | ORAL_TABLET | ORAL | Status: DC | PRN
  Administered 2020-01-04 – 2020-01-05 (×5): 650 mg via ORAL
  Filled 2020-01-04 (×5): qty 2

## 2020-01-04 NOTE — Discharge Summary (Signed)
Postpartum Discharge Summary     Patient Name: Felicia Clark DOB: 1996-02-28 MRN: [id number]  Date of admission: 01/04/2020 Delivery date:01/04/2020  Delivering provider: Arrie Senate  Date of discharge: 01/06/2020  Admitting diagnosis: GDM (gestational diabetes mellitus), class A1 [O24.410] Intrauterine pregnancy: [redacted]w[redacted]d    Secondary diagnosis:  Active Problems:   History of IUFD   GDM (gestational diabetes mellitus), class A1   Vaginal delivery  Additional problems: none    Discharge diagnosis: Term Pregnancy Delivered and GDM A1                                         Post partum procedures:none Augmentation: Cytotec Complications: None  Hospital course: Induction of Labor With Vaginal Delivery   24y.o. yo G2P0100 at 336w0das admitted to the hospital 01/04/2020 for induction of labor.  Indication for induction: A1 DM.  Patient had an uncomplicated labor course as follows: Membrane Rupture Time/Date: 6:20 AM ,01/04/2020   Delivery Method:Vaginal, Spontaneous  Episiotomy: None  Lacerations:  left sulcal repaired Details of delivery can be found in separate delivery note.  Patient had a routine postpartum course. Patient is discharged home 01/06/20.  Newborn Data: Birth date:01/04/2020  Birth time:9:04 AM  Gender:Female  Living status:Living  Apgars:8 ,8  Weight:2820 g   Magnesium Sulfate received: No BMZ received: No Rhophylac:N/A MMR:N/A T-DaP:Given prenatally Flu: N/A Transfusion:No  Physical exam  Vitals:   01/04/20 2146 01/05/20 0209 01/05/20 2144 01/06/20 0627  BP: 114/79 104/76 108/78 91/62  Pulse: 69 69 69 71  Resp: _0 Temp: 98.7 F (37.1 C) 98.3 F (36.8 C) 98.2 F (36.8 C) 98 F (36.7 C)  TempSrc: Oral Oral Oral Oral  SpO2:  100% 100% 99%  Weight:      Height:       General: alert, cooperative and no distress Lochia: appropriate Uterine Fundus: firm Incision: N/A DVT Evaluation: No evidence of DVT seen on physical exam. No cords  or calf tenderness. No significant calf/ankle edema. Labs: Lab Results  Component Value Date   WBC 7.6 01/04/2020   HGB 11.1 (L) 01/04/2020   HCT 34.1 (L) 01/04/2020   MCV 86.8 01/04/2020   PLT 346 01/04/2020   CMP Latest Ref Rng & Units 06/28/2019  Glucose 65 - 99 mg/dL 83  BUN 6 - 20 mg/dL 9  Creatinine 0.57 - 1.00 mg/dL 0.68  Sodium 134 - 144 mmol/L 136  Potassium 3.5 - 5.2 mmol/L 4.4  Chloride 96 - 106 mmol/L 102  CO2 20 - 29 mmol/L 20  Calcium 8.7 - 10.2 mg/dL 9.4  Total Protein 6.0 - 8.5 g/dL 7.3  Total Bilirubin 0.0 - 1.2 mg/dL <0.2  Alkaline Phos 39 - 117 IU/L 48  AST 0 - 40 IU/L 16  ALT 0 - 32 IU/L 7   Edinburgh Score: Edinburgh Postnatal Depression Scale Screening Tool 01/04/2020  I have been able to laugh and see the funny side of things. (No Data)  I have looked forward with enjoyment to things. -  I have blamed myself unnecessarily when things went wrong. -  I have been anxious or worried for no good reason. -  I have felt scared or panicky for no good reason. -  Things have been getting on top of me. -  I have been so unhappy that I have had difficulty sleeping. -  I  have felt sad or miserable. -  I have been so unhappy that I have been crying. -  The thought of harming myself has occurred to me. Flavia Shipper Postnatal Depression Scale Total -     After visit meds:  Allergies as of 01/06/2020   No Known Allergies     Medication List    STOP taking these medications   Accu-Chek Guide test strip Generic drug: glucose blood   Accu-Chek Softclix Lancets lancets   ondansetron 4 MG disintegrating tablet Commonly known as: Zofran ODT     TAKE these medications   Accu-Chek Guide w/Device Kit 1 Device by Does not apply route daily. Use as directed   acetaminophen 325 MG tablet Commonly known as: TYLENOL Take 2 tablets (650 mg total) by mouth every 6 (six) hours as needed for mild pain. What changed:   how much to take  reasons to take this    calcium carbonate 500 MG chewable tablet Commonly known as: TUMS - dosed in mg elemental calcium Chew 1 tablet by mouth daily.   coconut oil Oil Apply 1 application topically as needed (nipple pain).   ibuprofen 600 MG tablet Commonly known as: ADVIL Take 1 tablet (600 mg total) by mouth every 8 (eight) hours as needed for moderate pain or cramping.   PRENATAL VITAMINS PO Take 1 tablet by mouth daily.        Discharge home in stable condition Infant Feeding: Breast Infant Disposition:home with mother Discharge instruction: per After Visit Summary and Postpartum booklet. Activity: Advance as tolerated. Pelvic rest for 6 weeks.  Diet: routine diet Future Appointments: Future Appointments  Date Time Provider [place]  02/07/2020  8:20 AM [facility] LAB [facility]  02/07/2020  9:15 AM Laury Deep, CNM [facility]   Follow up Visit:   Please schedule this patient for a In person postpartum visit in 4 weeks with the following provider: Any provider. Additional Postpartum F/U:2 hour GTT in 4-6 weeks High risk pregnancy complicated by: GDM Delivery mode:  Vaginal, Spontaneous  Anticipated Birth Control:  Unsure, desires to decide at post partum visit  Willow Shidler, Gildardo Cranker, MD OB Fellow, Faculty Practice 01/06/2020 9:44 AM

## 2020-01-04 NOTE — Progress Notes (Signed)
Patient ID: Felicia Clark, female   DOB: 1996-03-11, 24 y.o.   MRN: [id number]  Sitting in high Fowlers currently; breathing w ctx and doing well; had SROM @ 0620 for mod MSF  BP 119/79, P 103 FHR 150s, + accels, occ early variables to 90s with ctx Ctx q 1-4 mins, still w/ some coupling Cx 9+ cm  IUP@39 .0wks GDMA2 Transition  Will have her go to the bathroom to empty her bladder and to sit on the toilet to see if there is any benefit for fetal positioning Anticipate vag del  Myrtis Ser [facility] 01/04/2020

## 2020-01-04 NOTE — Progress Notes (Signed)
Patient ID: Felicia Clark, female   DOB: 29-Jun-1995, 24 y.o.   MRN: [id number]  On H &K in bed, breathing well w ctx; s/p single dose of cytotec at 0100  BP 113/69, P 113 FHR 150-160, +10x10accels, occ mi variables Ctx q 1-4 mins, some coupling/tripling Cx 8/80/vtx -1 w/ BBOW per RN exam at 0500  IUP@39 .0wks GDMA1 Active labor/transition  Expectant management Anticipate vag del  Myrtis Ser [facility] 01/04/2020 5:25 AM

## 2020-01-04 NOTE — Progress Notes (Addendum)
Infant in NICU - couplet care transfer offered, pt declines at this time. MD aware.   DEBP set up per pt request - initiated by this RN. Pumping frequency, milk storage, and pump cleaning instructions given. Pt verbalized understanding but states she wants to wait until early morning to start pumping. Hand expression taught by RN, colostrum containers provided. Encouraged pt to hand express tonight and take any expressed colostrum to NICU for baby.   Gearldine Bienenstock, RN 01/04/2020 11:06 PM

## 2020-01-04 NOTE — H&P (Addendum)
OBSTETRIC ADMISSION HISTORY AND PHYSICAL  Felicia Clark is a 24 y.o. female G2P0100 with IUP at 34w0dby LMP presenting for IOL for A1GDM. She reports +FMs, No LOF, no VB, no blurry vision, headaches or peripheral edema, and RUQ pain.  She plans on breastfeeding. She will decide on birth control at her postpartum visit. She [facility] Dating: By LMP --->  Estimated Date of Delivery: 01/11/20  Sono:  '@[redacted]w[redacted]d' , CWD, normal anatomy, cephalic presentation,  23825K 44% EFW  Prenatal History/Complications: [id number] h/o IUFD at 21w (cystic hygroma and hydrops fetalis)  Past Medical History: Past Medical History:  Diagnosis Date  . Gestational diabetes mellitus     Past Surgical History: Past Surgical History:  Procedure Laterality Date  . NO PAST SURGERIES      Obstetrical History: OB History    Gravida  2   Para  1   Term      Preterm  1   AB      Living  0     SAB      TAB      Ectopic      Multiple      Live Births              Social History Social History   Socioeconomic History  . Marital status: Married    Spouse name: Not on file  . Number of children: Not on file  . Years of education: Not on file  . Highest education level: Not on file  Occupational History  . Not on file  Tobacco Use  . Smoking status: Never Smoker  . Smokeless tobacco: Never Used  Vaping Use  . Vaping Use: Never used  Substance and Sexual Activity  . Alcohol use: No  . Drug use: No  . Sexual activity: Yes    Birth control/protection: None  Other Topics Concern  . Not on file  Social History Narrative  . Not on file   Social Determinants of Health   Financial Resource Strain:   . Difficulty of Paying Living Expenses: Not on file  Food Insecurity: No Food Insecurity  . Worried About RCharity fundraiserin the Last Year: Never true  . Ran Out of Food in the Last Year: Never true  Transportation Needs: No Transportation Needs  . Lack of  Transportation (Medical): No  . Lack of Transportation (Non-Medical): No  Physical Activity:   . Days of Exercise per Week: Not on file  . Minutes of Exercise per Session: Not on file  Stress:   . Feeling of Stress : Not on file  Social Connections:   . Frequency of Communication with Friends and Family: Not on file  . Frequency of Social Gatherings with Friends and Family: Not on file  . Attends Religious Services: Not on file  . Active Member of Clubs or Organizations: Not on file  . Attends CArchivistMeetings: Not on file  . Marital Status: Not on file    Family History: Family History  Problem Relation Age of Onset  . Fibroids Mother   . Kidney disease Maternal Grandmother     Allergies: No Known Allergies  Medications Prior to Admission  Medication Sig Dispense Refill Last Dose  . Accu-Chek Softclix Lancets lancets Patient to check Blood Glucose 4 times daily 100 each 12   . acetaminophen (TYLENOL) 325 MG tablet Take 325 mg by mouth every 6 (six) hours as needed.      .Marland Kitchen  Blood Glucose Monitoring Suppl (ACCU-CHEK GUIDE) w/Device KIT 1 Device by Does not apply route daily. Use as directed 1 kit 0   . calcium carbonate (TUMS - DOSED IN MG ELEMENTAL CALCIUM) 500 MG chewable tablet Chew 1 tablet by mouth daily.     Marland Kitchen glucose blood (ACCU-CHEK GUIDE) test strip Patient to check Blood Glucose 4 times daily 100 each 12   . ondansetron (ZOFRAN ODT) 4 MG disintegrating tablet Take 1 tablet (4 mg total) by mouth every 6 (six) hours as needed for nausea. 30 tablet 3   . Prenatal Vit-Fe Fumarate-FA (PRENATAL VITAMINS PO) Take 1 tablet by mouth daily.        Review of Systems   All systems reviewed and negative except as stated in HPI  Weight 55.4 kg, last menstrual period 04/06/2019, unknown if currently breastfeeding. General appearance: alert, cooperative and appears stated age Lungs: clear to auscultation bilaterally Heart: regular rate and rhythm Abdomen: soft,  non-tender; bowel sounds normal Pelvic: 2+/60/vtx -2 Extremities: Homans sign is negative, no sign of DVT Presentation: cephalic Fetal monitoringBaseline: 150 bpm, Variability: Good {> 6 bpm), Accelerations: Reactive and Decelerations: Variable: mild and intermittent Uterine activity: every 7 minutes    Prenatal labs: ABO, Rh: O/Positive/-- (02/24 1541) Antibody: Negative (02/24 1541) Rubella: 3.59 (02/24 1541) RPR: Non Reactive (06/15 0933)  HBsAg: Negative (02/24 1541)  HIV: Non Reactive (06/15 0933)  GBS: Negative/-- (08/16 0824)  1 hr Glucola: 69/145/154 Genetic screening  Low risk Anatomy US: wnl at [redacted]w[redacted]d(echogenic cardiac focus but low risk NIPS)  Prenatal Transfer Tool  Maternal Diabetes: Yes:  Diabetes Type:  Diet controlled Genetic Screening: Normal Maternal Ultrasounds/Referrals: Normal (echogenic cardiac focus but low risk NIPS) Fetal Ultrasounds or other Referrals:  None Maternal Substance Abuse:  No Significant Maternal Medications:  None Significant Maternal Lab Results: Group B Strep negative  No results found for this or any previous visit (from the past 24 hour(s)).  Patient Active Problem List   Diagnosis Date Noted  . GDM (gestational diabetes mellitus), class A1 01/04/2020  . Gestational diabetes 10/19/2019  . 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 Supervision of high risk pregnancy, antepartum 06/13/2019  . History of IUFD 06/13/2019    Assessment/Plan:  ADystany Duffyis a 24y.o. G2P0100 at 358w0dere for IOL for A1GDM.  #Labor: IOL with cytotec to start, then either a 2nd dose or Pitocin in 4h #Pain: Recently received Fentanyl for good relief #FWB:  Category II strip given intermittent mild variables but reassuringly good variability with multiple accels and good recovery after variables. #ID: GBS negative #MOF: breast #[facility] #[facility]:  N/A  [facility]Myrtis SerCNBell[facility]/06/2019 2:17 AM

## 2020-01-04 NOTE — Discharge Instructions (Signed)

## 2020-01-05 NOTE — Lactation Note (Addendum)
This note was copied from a baby's chart. Lactation Consultation Note  Patient Name: Girl Kinnedy Mongiello Today's Date: 01/05/2020 Reason for consult: Initial assessment  Baby girl Ava now 30 hours old transferred to NICU.  Mom has iniittiated pumping with Ava. Mom repots she did not get anything when she pumped.  Mom reports pumping was a little throbbing. Mom reports positive breast changes during pregnancy.  Mom has not been taught hand expression and massage she reports.  Connell demo hand expression with mom.  Unable to get anything on left breast.  Urged mom to add lots of massage first.  Mom able to get a drop or two on right breast.  When doing hand expression discussed flange fit with mom.  Mom has been using 24 mm flanges.  Discussed trying 21 mm flanges.  Asked mom if she cared if her nipples we measured her nipples. Mom said no.  LC measured her nipples and fitted mom with 21 mm flanges and got her some coconut oil.  Asked mom if we could try pumping with her and see if she felt mom comfortable.  Mom agreed. Mom reports 21 mm flanges and coconut oil is more comfortable.  Mom does not have a breastpump for home use.  Mom reports she has [facility] to [facility] about loaner DEBP.  Reviewed and gave NICU Booklet.  Urged to pump 10 times day for 15 minutes right now. Reviewed and Gave breastfeeding handouts for [facility] resource ;ist for discharge.  Sent WIC referral.  Eliott Nine mom to call lactation as needed                  Rollen Sox 01/05/2020, 2:47 PM

## 2020-01-05 NOTE — Progress Notes (Signed)
POSTPARTUM PROGRESS NOTE  Subjective: Felicia Clark is a 24 y.o. G2P1101 s/p vaginal delivery at [redacted]w[redacted]d.  She reports she is doing well. No acute events overnight. She denies any problems with ambulating, voiding or po intake. Denies nausea or vomiting. She has passed flatus. Pain is moderately controlled; improved with use of heating pad.  Lochia is minimal.  Objective: Blood pressure 104/76, pulse 69, temperature 98.3 F (36.8 C), temperature source Oral, resp. rate 16, height 5' (1.524 m), weight 55.4 kg, last menstrual period 04/06/2019, SpO2 100 %, unknown if currently breastfeeding.  Physical Exam:  General: alert, cooperative and no distress Chest: no respiratory distress Abdomen: soft, non-tender  Uterine Fundus: firm and at level of umbilicus Extremities: No calf swelling or tenderness  no LE edema  Recent Labs    01/04/20 0051  HGB 11.1*  HCT 34.1*    Assessment/Plan: Felicia Clark is a 24 y.o. G2P1101 s/p vaginal delivery at [redacted]w[redacted]d.  Routine Postpartum Care: Doing well, pain well-controlled.  -- Continue routine care, lactation support  -- Contraception: undecided--pt prefers to defer to PP visit s/p counseling -- Feeding: breast -- A1GDM: plan for 2hr gtt at 4-6 weeks postpartum  Dispo: Plan for discharge PPD#2.  Randa Ngo, MD OB Fellow, Faculty [facility] 01/05/2020 8:33 AM

## 2020-01-05 NOTE — Progress Notes (Signed)
Patient screened out for psychosocial assessment since none of the following apply:  Psychosocial stressors documented in mother or baby's chart  Gestation less than 32 weeks  Code at delivery   Infant with anomalies Please contact the Clinical Social Worker if specific needs arise, by MOB's request, or if MOB scores greater than 9/yes to question 10 on Edinburgh Postpartum Depression Screen.  Nyellie Yetter, LCSW Clinical Social Worker [facility] Cell#: [phone]     

## 2020-01-06 MED ORDER — IBUPROFEN 600 MG PO TABS: 600.0000 mg | ORAL_TABLET | Freq: Three times a day (TID) | ORAL | 0 refills | Status: AC | PRN

## 2020-01-06 MED ORDER — ACETAMINOPHEN 325 MG PO TABS: 650.0000 mg | ORAL_TABLET | Freq: Four times a day (QID) | ORAL | Status: AC | PRN

## 2020-01-06 MED ORDER — COCONUT OIL OIL: 1.0000 "application " | TOPICAL_OIL | 0 refills | Status: AC | PRN

## 2020-01-09 ENCOUNTER — Telehealth: Payer: Self-pay | Admitting: *Deleted

## 2020-01-09 NOTE — Telephone Encounter (Signed)
Transition Care Management Unsuccessful Follow-up Telephone Call  Date of discharge and from where:  01/06/20, [facility]  Attempts:  1st Attempt  Reason for unsuccessful TCM follow-up call:  Left voice message.  Lenor Coffin, RN, BSN, [place] Patient [place] [phone]

## 2020-01-10 ENCOUNTER — Encounter: Payer: Self-pay | Admitting: Obstetrics & Gynecology

## 2020-01-10 ENCOUNTER — Telehealth: Payer: Self-pay | Admitting: *Deleted

## 2020-01-10 NOTE — Telephone Encounter (Signed)
Contacted patient to complete Transition of Care Assessment: Transition Care Management Follow-up Telephone Call  Date of discharge and from where: 01/06/20, [facility]   How have you been since you were released from the hospital? "I'm good"  Any questions or concerns? No  Items Reviewed:  Did the pt receive and understand the discharge instructions provided? Yes   Medications obtained and verified? Yes   Any new allergies since your discharge? No   Dietary orders reviewed? YES  Do you have support at home? Yes family  Functional Questionnaire: (I = Independent and D = Dependent) ADLs: I  Bathing/Dressing- I  Meal Prep- I  Eating- I  Maintaining continence- I  Transferring/Ambulation- I  Managing Meds- I  Follow up appointments reviewed:   PCP Hospital f/u appt confirmed? No Patient would like to be assigned a PCP.  [facility] f/u appt confirmed? Yes  Scheduled to see Laury Deep, CNM on 02/07/20 @ 0915.  Are transportation arrangements needed? No   If their condition worsens, is the pt aware to call PCP or go to the Emergency Dept.? YES  Was the patient provided with contact information for the PCP's office or ED? YES   Was to pt encouraged to call back with questions or concerns? YES  Lenor Coffin, RN, BSN, [place] Patient [place] [phone]

## 2020-01-11 ENCOUNTER — Encounter: Payer: Self-pay | Admitting: General Practice

## 2020-02-07 ENCOUNTER — Other Ambulatory Visit: Payer: Self-pay | Admitting: General Practice

## 2020-02-07 ENCOUNTER — Other Ambulatory Visit: Payer: Medicaid Other

## 2020-02-07 ENCOUNTER — Other Ambulatory Visit: Payer: Self-pay

## 2020-02-07 ENCOUNTER — Encounter: Payer: Self-pay | Admitting: Obstetrics and Gynecology

## 2020-02-07 ENCOUNTER — Ambulatory Visit (INDEPENDENT_AMBULATORY_CARE_PROVIDER_SITE_OTHER): Payer: Medicaid Other | Admitting: Obstetrics and Gynecology

## 2020-02-07 DIAGNOSIS — Z30011 Encounter for initial prescription of contraceptive pills: Secondary | ICD-10-CM

## 2020-02-07 DIAGNOSIS — O0990 Supervision of high risk pregnancy, unspecified, unspecified trimester: Secondary | ICD-10-CM

## 2020-02-07 MED ORDER — NORGESTIMATE-ETH ESTRADIOL 0.25-35 MG-MCG PO TABS: 1.0000 | ORAL_TABLET | Freq: Every day | ORAL | 11 refills | Status: AC

## 2020-02-07 NOTE — Progress Notes (Addendum)
Felicia Clark Visit Note  Felicia Clark is a 24 y.o. G58P1101 female who presents for a postpartum visit. She is 4 weeks postpartum following a SVD.  I have fully reviewed the prenatal and intrapartum course. The delivery was at 46 gestational weeks.  Anesthesia: none. Postpartum course has been unremarkable. Baby is doing well. Baby is feeding by bottle - Similac with Iron and expressed breast milk. Bleeding thin lochia. Bowel function is normal. Bladder function is normal. Patient is not sexually active. Contraception method is OCP (estrogen/progesterone). Postpartum depression screening: Negative.   The pregnancy intention screening data noted above was reviewed. Potential methods of contraception were discussed. The patient elected to proceed with Oral Contraceptive.    Edinburgh Postnatal Depression Scale - 02/07/20 0905      Edinburgh Postnatal Depression Scale:  In the Past 7 Days   I have been able to laugh and see the funny side of things. 0    I have looked forward with enjoyment to things. 0    I have blamed myself unnecessarily when things went wrong. 2    I have been anxious or worried for no good reason. 0    I have felt scared or panicky for no good reason. 2    Things have been getting on top of me. 0    I have been so unhappy that I have had difficulty sleeping. 0    I have felt sad or miserable. 0    I have been so unhappy that I have been crying. 0    The thought of harming myself has occurred to me. 0    Edinburgh Postnatal Depression Scale Total 4            The following portions of the patient's history were reviewed and updated as appropriate: allergies, current medications, past family history, past medical history, past social history, past surgical history and problem list.  Review of Systems Constitutional: negative Eyes: negative Ears, nose, mouth, throat, and face: negative Respiratory: negative Cardiovascular: negative Gastrointestinal:  negative Genitourinary:negative Integument/breast: positive for pumping "not as much as needed" Hematologic/lymphatic: negative Musculoskeletal:negative Neurological: negative Behavioral/Psych: negative Endocrine: negative Allergic/Immunologic: negative    Objective:  Blood pressure 111/77, pulse 78, weight 111 lb 11.2 oz (50.7 kg), last menstrual period 04/06/2019, unknown if currently breastfeeding.  General:  alert, cooperative and no distress   Breasts:  inspection negative, no nipple discharge or bleeding, no masses or nodularity palpable  Lungs: clear to auscultation bilaterally  Heart:  regular rate and rhythm, S1, S2 normal, no murmur, click, rub or gallop  Abdomen: soft, non-tender; bowel sounds normal; no masses,  no organomegaly   Vulva:  not evaluated  Vagina: not evaluated  Cervix:  not evaluated  Corpus: not examined  Adnexa:  not evaluated  Rectal Exam: Not performed.        Assessment:  1. Encounter for postpartum care of lactating mother  Normal postpartum exam. Pap smear 06/03/2017   2. Encounter for initial prescription of contraceptive pills   Plan:   Essential components of care per ACOG recommendations:  1.  Mood and well being: Patient with negative depression screening today. Reviewed local resources for support.  - Patient does not use tobacco.  - hx of drug use? No    2. Infant care and feeding:  -Patient currently breastmilk feeding? Yes If breastmilk feeding discussed return to work and pumping. If needed, patient was provided letter for work to allow for every 2-3 hr  pumping breaks, and to be granted a private location to express breastmilk and refrigerated area to store breastmilk. Reviewed importance of draining breast regularly to support lactation. -Social determinants of health (SDOH) reviewed in EPIC. No concerns  3. Sexuality, contraception and birth spacing - Patient does not want a pregnancy in the next year.  Desired family size is  unsure of number of children.  - Reviewed forms of contraception in tiered fashion. Patient desired oral contraceptives (estrogen/progesterone) today. Rx for Sprintec sent to pharmacy. - Discussed birth spacing of 18 months  4. Sleep and fatigue -Encouraged family/partner/community support of 4 hrs of uninterrupted sleep to help with mood and fatigue  5. Physical Recovery  - Discussed patients delivery and complications - Patient had a LT suchal laceration, perineal healing reviewed. Patient expressed understanding - Patient has urinary incontinence? No - Patient is safe to resume physical and sexual activity  6.  Health Maintenance - Last pap smear done 06/03/2017 and was normal with negative HPV. Mammogram not indicated at this time  7. NoChronic Disease - PCP follow up  Laury Deep, [place] for [place]

## 2020-02-07 NOTE — Addendum Note (Signed)
Addended by: Graceann Congress on: 02/07/2020 10:02 AM   Modules accepted: Orders

## 2020-02-08 LAB — GLUCOSE TOLERANCE, 2 HOURS
Glucose, 2 hour: 69 mg/dL (ref 65–139)
Glucose, GTT - Fasting: 70 mg/dL (ref 65–99)

## 2020-10-22 DIAGNOSIS — H5213 Myopia, bilateral: Secondary | ICD-10-CM | POA: Diagnosis not present

## 2022-06-22 DIAGNOSIS — N898 Other specified noninflammatory disorders of vagina: Secondary | ICD-10-CM | POA: Diagnosis not present

## 2022-06-22 DIAGNOSIS — N760 Acute vaginitis: Secondary | ICD-10-CM | POA: Diagnosis not present

## 2022-06-22 DIAGNOSIS — R8271 Bacteriuria: Secondary | ICD-10-CM | POA: Diagnosis not present

## 2022-06-22 DIAGNOSIS — Z113 Encounter for screening for infections with a predominantly sexual mode of transmission: Secondary | ICD-10-CM | POA: Diagnosis not present

## 2022-06-22 DIAGNOSIS — Z3202 Encounter for pregnancy test, result negative: Secondary | ICD-10-CM | POA: Diagnosis not present

## 2022-06-22 DIAGNOSIS — B3731 Acute candidiasis of vulva and vagina: Secondary | ICD-10-CM | POA: Diagnosis not present

## 2022-07-07 DIAGNOSIS — Z23 Encounter for immunization: Secondary | ICD-10-CM | POA: Diagnosis not present

## 2022-07-18 DIAGNOSIS — F332 Major depressive disorder, recurrent severe without psychotic features: Secondary | ICD-10-CM | POA: Diagnosis not present

## 2022-08-01 DIAGNOSIS — F332 Major depressive disorder, recurrent severe without psychotic features: Secondary | ICD-10-CM | POA: Diagnosis not present

## 2022-09-17 DIAGNOSIS — F332 Major depressive disorder, recurrent severe without psychotic features: Secondary | ICD-10-CM | POA: Diagnosis not present

## 2022-10-08 DIAGNOSIS — K0889 Other specified disorders of teeth and supporting structures: Secondary | ICD-10-CM | POA: Diagnosis not present

## 2022-10-08 DIAGNOSIS — F332 Major depressive disorder, recurrent severe without psychotic features: Secondary | ICD-10-CM | POA: Diagnosis not present

## 2022-11-04 DIAGNOSIS — Z13220 Encounter for screening for lipoid disorders: Secondary | ICD-10-CM | POA: Diagnosis not present

## 2022-11-04 DIAGNOSIS — Z1321 Encounter for screening for nutritional disorder: Secondary | ICD-10-CM | POA: Diagnosis not present

## 2022-11-04 DIAGNOSIS — Z131 Encounter for screening for diabetes mellitus: Secondary | ICD-10-CM | POA: Diagnosis not present

## 2022-11-04 DIAGNOSIS — Z1329 Encounter for screening for other suspected endocrine disorder: Secondary | ICD-10-CM | POA: Diagnosis not present

## 2023-10-12 DIAGNOSIS — O358XX0 Maternal care for other (suspected) fetal abnormality and damage, not applicable or unspecified: Secondary | ICD-10-CM | POA: Diagnosis not present

## 2023-10-12 DIAGNOSIS — Z3A24 24 weeks gestation of pregnancy: Secondary | ICD-10-CM | POA: Diagnosis not present

## 2024-01-06 DIAGNOSIS — Z3483 Encounter for supervision of other normal pregnancy, third trimester: Secondary | ICD-10-CM | POA: Diagnosis not present

## 2024-01-06 DIAGNOSIS — O26843 Uterine size-date discrepancy, third trimester: Secondary | ICD-10-CM | POA: Diagnosis not present

## 2024-01-06 DIAGNOSIS — Z3A36 36 weeks gestation of pregnancy: Secondary | ICD-10-CM | POA: Diagnosis not present

## 2024-01-17 DIAGNOSIS — Z3A38 38 weeks gestation of pregnancy: Secondary | ICD-10-CM | POA: Diagnosis not present

## 2024-01-17 DIAGNOSIS — O9902 Anemia complicating childbirth: Secondary | ICD-10-CM | POA: Diagnosis not present

## 2024-01-17 DIAGNOSIS — D649 Anemia, unspecified: Secondary | ICD-10-CM | POA: Diagnosis not present

## 2024-03-02 DIAGNOSIS — H5213 Myopia, bilateral: Secondary | ICD-10-CM | POA: Diagnosis not present

## 2024-03-06 DIAGNOSIS — Z309 Encounter for contraceptive management, unspecified: Secondary | ICD-10-CM | POA: Diagnosis not present

## 2024-03-06 DIAGNOSIS — Z30013 Encounter for initial prescription of injectable contraceptive: Secondary | ICD-10-CM | POA: Diagnosis not present
